# Patient Record
Sex: Male | Born: 1950 | Race: Black or African American | Hispanic: No | Smoking: Former smoker
Health system: Southern US, Community
[De-identification: ages and names within clinical notes are randomized; demographics above are authoritative.]

---

## 2011-01-18 ENCOUNTER — Other Ambulatory Visit: Payer: Self-pay | Admitting: Cardiovascular Disease

## 2011-01-18 ENCOUNTER — Ambulatory Visit
Admission: RE | Admit: 2011-01-18 | Discharge: 2011-01-18 | Disposition: A | Discharge status: Home / Self Care | Payer: 59 | Source: Ambulatory Visit | Attending: Cardiovascular Disease | Admitting: Cardiovascular Disease

## 2011-01-18 DIAGNOSIS — Z01811 Encounter for preprocedural respiratory examination: Secondary | ICD-10-CM

## 2011-01-18 DIAGNOSIS — I2510 Atherosclerotic heart disease of native coronary artery without angina pectoris: Secondary | ICD-10-CM

## 2011-01-22 ENCOUNTER — Inpatient Hospital Stay (HOSPITAL_COMMUNITY)
Admission: RE | Admit: 2011-01-22 | Discharge: 2011-01-29 | DRG: 234 | Disposition: A | Discharge status: Home / Self Care | Payer: 59 | Source: Ambulatory Visit | Attending: Thoracic Surgery (Cardiothoracic Vascular Surgery) | Admitting: Thoracic Surgery (Cardiothoracic Vascular Surgery)

## 2011-01-22 DIAGNOSIS — D62 Acute posthemorrhagic anemia: Secondary | ICD-10-CM | POA: Diagnosis not present

## 2011-01-22 DIAGNOSIS — I2510 Atherosclerotic heart disease of native coronary artery without angina pectoris: Principal | ICD-10-CM | POA: Diagnosis present

## 2011-01-22 DIAGNOSIS — F17200 Nicotine dependence, unspecified, uncomplicated: Secondary | ICD-10-CM | POA: Diagnosis present

## 2011-01-22 DIAGNOSIS — E785 Hyperlipidemia, unspecified: Secondary | ICD-10-CM | POA: Diagnosis present

## 2011-01-22 DIAGNOSIS — Z01818 Encounter for other preprocedural examination: Secondary | ICD-10-CM

## 2011-01-22 DIAGNOSIS — D696 Thrombocytopenia, unspecified: Secondary | ICD-10-CM | POA: Diagnosis not present

## 2011-01-22 DIAGNOSIS — I10 Essential (primary) hypertension: Secondary | ICD-10-CM | POA: Diagnosis present

## 2011-01-23 ENCOUNTER — Ambulatory Visit (HOSPITAL_COMMUNITY): Payer: 59

## 2011-01-23 LAB — LIPID PANEL
Cholesterol: 148 mg/dL (ref 0–200)
VLDL: 21 mg/dL (ref 0–40)

## 2011-01-23 LAB — BLOOD GAS, ARTERIAL
Acid-Base Excess: 0.7 mmol/L (ref 0.0–2.0)
Bicarbonate: 24.5 mEq/L — ABNORMAL HIGH (ref 20.0–24.0)
Patient temperature: 98.6
TCO2: 25.6 mmol/L (ref 0–100)

## 2011-01-23 LAB — CBC
MCH: 34 pg (ref 26.0–34.0)
MCHC: 34.9 g/dL (ref 30.0–36.0)
RDW: 13.7 % (ref 11.5–15.5)

## 2011-01-23 LAB — COMPREHENSIVE METABOLIC PANEL
ALT: 12 U/L (ref 0–53)
Alkaline Phosphatase: 83 U/L (ref 39–117)
BUN: 16 mg/dL (ref 6–23)
CO2: 26 mEq/L (ref 19–32)
Chloride: 101 mEq/L (ref 96–112)
GFR calc Af Amer: >60 mL/min (ref >60)
Glucose, Bld: 110 mg/dL — ABNORMAL HIGH (ref 70–99)
Potassium: 4.1 mEq/L (ref 3.5–5.1)
Total Bilirubin: 0.4 mg/dL (ref 0.3–1.2)

## 2011-01-23 LAB — URINALYSIS, ROUTINE W REFLEX MICROSCOPIC
Bilirubin Urine: NEGATIVE
Nitrite: NEGATIVE
Protein, ur: 30 mg/dL — AB
Specific Gravity, Urine: 1.014 (ref 1.005–1.030)
Urobilinogen, UA: 1 mg/dL (ref 0.0–1.0)

## 2011-01-23 LAB — HEMOGLOBIN A1C
Hgb A1c MFr Bld: 5.4 % (ref <5.7)
Mean Plasma Glucose: 108 mg/dL (ref <117)

## 2011-01-23 LAB — MRSA PCR SCREENING: MRSA by PCR: NEGATIVE

## 2011-01-23 LAB — URINE MICROSCOPIC-ADD ON

## 2011-01-23 NOTE — Cardiovascular Report (Signed)
Conley, Mason NO.:  [id number]  MEDICAL RECORD NO.:  [id number]  LOCATION:  6523                         FACILITY:  [facility]  PHYSICIAN:  Landry Corporal, MD DATE OF BIRTH:  11-27-1950  DATE OF PROCEDURE:  01/22/2011 DATE OF DISCHARGE:                           CARDIAC CATHETERIZATION   PRIMARY CARE PHYSICIAN:  Mark A. Waynard Edwards, MD.  PERFORMING PHYSICIAN:  Landry Corporal, MD.  PROCEDURES PERFORMED: 1. Native coronary angiography. 2. Intracoronary nitroglycerin.  INDICATIONS:  Abnormal Myoview.  BRIEF HISTORY:  Mason Conley is a very pleasant 60 year old gentleman with a history of hypertension, hyperlipidemia, long-term smoker with the family history of coronary artery disease who was referred to me by Dr. Waynard Edwards with abnormal EKG.  Just based on the nature of the EKG, I did perform an echocardiogram and Persantine Myoview.  The echocardiogram showed EF 50% to 55% with mild-to-moderate anterior wall hypokinesis andthe Persantine Myoview showed a moderate perfusion defect with mild peri- infarct ischemia in the mid anterior septal and anterior apical regions and without any high risk of recommending cardiac catheterization and was therefore referred for diagnostic cardiac catheterization.  The risks, benefits, alternatives, and indications of the procedure were explained to the patient in detail.  Informed consent was obtained with the signed form placed on the chart.  The patient was brought to the holding area to the Second [facility] in a fasting state.  After a Barbeau test was performed, the right wrist was prepped.  The patient was prepped and draped in the usual sterile fashion for radial artery access.  After the patient was draped, a time-out period was performed. The patient was sedated with intravenous Versed and fentanyl.  The right wrist was then anesthetized using 1% subcutaneous lidocaine and  the right radial artery accessed using the Seldinger technique placement with a 5-French sheath.  The sheath was then aspirated and flushed and infiltrated with 10 mL of dilated radial cocktail, which consists of 5 mg verapamil, 400 mcg of nitroglycerin and 2 mL of lidocaine of 1%. After this was performed, a 5-French TIGR 4.0 catheter was advanced over safety J-wire into the ascending aorta.  The catheter was used through first engage the left coronary artery and multiple angiographic views of left coronary system were obtained.  The catheter was then redirected to the right coronary artery.  Multiple angiographic views of right coronary system were obtained.  I was attempted to remove the t.i.d. catheter over a wire was noted to have spasm in the brachial artery. The patient noted some discomfort in his biceps, then removed the wire and injected initially 200 mcg of nitroglycerin and then overwired and pulled the catheter back to little further; and at this time, I gave 2.5 mg of verapamil through the catheter.  This alleviates on the discomfort and pulled the catheter completely out of body over a wire without any remaining problems.  The sheath was then removed in the cath lab with placement of TR band at 10 cm of area.  Initially due to spasm, there was negative reversed Barbeau test with plethysmography; however, the loosening some of the pressure in the bladder,  there was adequate nonocclusive hemostasis.  The patient was stable for and after the procedure.  No complications with the exception of the small amount of spasm.  With this MI,  I chose not to proceed with going up with pigtail for left ventriculogram.  He had a relatively recent echocardiogram.  CATHETERIZATION STATISTICS: 1. Total sedation was 1 mg Versed, 50 mcg of fentanyl. 2. Heparin 4000 units IV. 3. Radial cocktail as above. 4. Intracoronary nitroglycerin injection x1 200 mcg.  Intraarterial     injection of  200 mcg as well.  Additional, 2.5 mg of intraarterial     verapamil. 5. Contrast total 70 mL. 6. A 500 mL normal saline boluses measured.  HEMODYNAMICS: 1. Central aortic pressure 97/61 mmHg with a mean of 77 mmHg. 2. The EF was 50% to 55% with mild anterior hypokinesis.  ANGIOGRAPHIC FINDINGS: 1. Left main is a large-caliber vessel, representing a 30-40% stenosis     before gives rise to a LAD and circumflex artery. 2. Circumflex artery is a very tortuous course and has a very high OM     versus a ramus intermedius.  The ostial portion of the OM1, ramus     intermedius has a roughly 70% lesion followed by tubular 50-60%     lesion.  This is a very large vessel in the course down the     midportion of the heart.  The remaining of the circumflex is free     of any significant disease, but there is a very small OM2 with an ostial      60% lesion. 3. The LAD has an ostial 95% long tubular lesion.  Then in the     midportion, there is a 70% to 80% lesion involving the bifurcation     of the first diagonal branch followed by another 70% lesion with     involving the bifurcation of the second diagonal branch.  The     remainder of the LAD course down around the apex.  The tapers into     being relatively small moderate-sized vessel.  The circumflex is a     very large vessel. 4. The right coronary artery is a large-caliber dominant vessel is     free of disease in the  proximal and distal portion; however, at     the bifurcation, the posterior descending artery and the right     posterior atrioventricular groove branch.  The ostial portion of     the PDA has 80% lesion and ostial portion of the atrioventricular     groove branch has 60% lesion.  The PDA is a very large caliber vessel as the continuation of the RCA with the PDA branch being a small amount of caliber vessel.  With covering a large posterolateral distribution.  IMPRESSION: 1. Severe multivessel disease involving the  ostial proximal and mid     LAD lesions with some diffuse lesions involving the D1 and D2     ostia. 2. Severe ostial right PDA and moderate AV lesions. 3. Moderate to severe ostial and proximal high ramus intermedius.  PLAN: 1. There is a very high-grade ostial LAD lesion with the significant     lesions downstream, that is probably best served with bypass     surgery.  I was consulted to see CVTS and I was told that Dr. Cornelius Moras     will see the patient.     The patient needs to continue strenuous selective cessation  counseling. 2. He is on a statin and ACE inhibitor and was not on the beta-blocker     due to based on the bradycardia, which could be like titrated up     during hospitalization.          ______________________________ Landry Corporal, MD     DWH/MEDQ  D:  01/22/2011  T:  01/23/2011  Job:  [id number]  cc:   Loraine Leriche A. Perini, M.D. Salvatore Decent. Cornelius Moras, M.D.  Electronically Signed by Bryan Lemma MD on 01/23/2011 12:06:41 PM

## 2011-01-24 LAB — CBC
MCV: 98 fL (ref 78.0–100.0)
Platelets: 151 10*3/uL (ref 150–400)
RBC: 4.04 MIL/uL — ABNORMAL LOW (ref 4.22–5.81)
RDW: 13.4 % (ref 11.5–15.5)
WBC: 10.2 10*3/uL (ref 4.0–10.5)

## 2011-01-24 LAB — BASIC METABOLIC PANEL
CO2: 26 mEq/L (ref 19–32)
Chloride: 100 mEq/L (ref 96–112)
Creatinine, Ser: 0.83 mg/dL (ref 0.50–1.35)
GFR calc Af Amer: >60 mL/min (ref >60)
Potassium: 4.3 mEq/L (ref 3.5–5.1)
Sodium: 137 mEq/L (ref 135–145)

## 2011-01-24 LAB — ABO/RH: ABO/RH(D): O NEG

## 2011-01-25 ENCOUNTER — Ambulatory Visit (HOSPITAL_COMMUNITY): Payer: 59

## 2011-01-25 DIAGNOSIS — Z01810 Encounter for preprocedural cardiovascular examination: Secondary | ICD-10-CM

## 2011-01-25 DIAGNOSIS — I2510 Atherosclerotic heart disease of native coronary artery without angina pectoris: Secondary | ICD-10-CM

## 2011-01-25 LAB — CBC
HCT: 36.8 % — ABNORMAL LOW (ref 39.0–52.0)
Hemoglobin: 12.8 g/dL — ABNORMAL LOW (ref 13.0–17.0)
MCV: 97.9 fL (ref 78.0–100.0)
RBC: 3.76 MIL/uL — ABNORMAL LOW (ref 4.22–5.81)
RDW: 13.5 % (ref 11.5–15.5)
WBC: 7.5 10*3/uL (ref 4.0–10.5)

## 2011-01-25 LAB — BASIC METABOLIC PANEL
BUN: 17 mg/dL (ref 6–23)
CO2: 26 mEq/L (ref 19–32)
Chloride: 103 mEq/L (ref 96–112)
Creatinine, Ser: 0.85 mg/dL (ref 0.50–1.35)
GFR calc Af Amer: >60 mL/min (ref >60)
Potassium: 3.9 mEq/L (ref 3.5–5.1)

## 2011-01-26 ENCOUNTER — Inpatient Hospital Stay (HOSPITAL_COMMUNITY): Payer: 59

## 2011-01-26 DIAGNOSIS — I2510 Atherosclerotic heart disease of native coronary artery without angina pectoris: Secondary | ICD-10-CM

## 2011-01-26 LAB — POCT I-STAT 3, ART BLOOD GAS (G3+)
Acid-Base Excess: 1 mmol/L (ref 0.0–2.0)
Acid-base deficit: 2 mmol/L (ref 0.0–2.0)
Bicarbonate: 22.3 mEq/L (ref 20.0–24.0)
O2 Saturation: 99 %
O2 Saturation: 91 %
Patient temperature: 36.9
Patient temperature: 37.3
TCO2: 17 mmol/L (ref 0–100)
TCO2: 22 mmol/L (ref 0–100)
pCO2 arterial: 37.6 mmHg (ref 35.0–45.0)
pCO2 arterial: 24.4 mmHg — ABNORMAL LOW (ref 35.0–45.0)
pO2, Arterial: 131 mmHg — ABNORMAL HIGH (ref 80.0–100.0)
pO2, Arterial: 47 mmHg — ABNORMAL LOW (ref 80.0–100.0)
pO2, Arterial: 133 mmHg — ABNORMAL HIGH (ref 80.0–100.0)

## 2011-01-26 LAB — POCT I-STAT, CHEM 8
Creatinine, Ser: 0.8 mg/dL (ref 0.50–1.35)
Glucose, Bld: 105 mg/dL — ABNORMAL HIGH (ref 70–99)
Hemoglobin: 7.5 g/dL — ABNORMAL LOW (ref 13.0–17.0)
Potassium: 4.5 mEq/L (ref 3.5–5.1)
TCO2: 20 mmol/L (ref 0–100)

## 2011-01-26 LAB — POCT I-STAT 4, (NA,K, GLUC, HGB,HCT)
Glucose, Bld: 102 mg/dL — ABNORMAL HIGH (ref 70–99)
Glucose, Bld: 96 mg/dL (ref 70–99)
Glucose, Bld: 109 mg/dL — ABNORMAL HIGH (ref 70–99)
Glucose, Bld: 70 mg/dL (ref 70–99)
HCT: 36 % — ABNORMAL LOW (ref 39.0–52.0)
HCT: 23 % — ABNORMAL LOW (ref 39.0–52.0)
HCT: 24 % — ABNORMAL LOW (ref 39.0–52.0)
HCT: 26 % — ABNORMAL LOW (ref 39.0–52.0)
Hemoglobin: 12.2 g/dL — ABNORMAL LOW (ref 13.0–17.0)
Hemoglobin: 7.8 g/dL — ABNORMAL LOW (ref 13.0–17.0)
Hemoglobin: 8.8 g/dL — ABNORMAL LOW (ref 13.0–17.0)
Hemoglobin: 11.2 g/dL — ABNORMAL LOW (ref 13.0–17.0)
Potassium: 5.8 mEq/L — ABNORMAL HIGH (ref 3.5–5.1)
Potassium: 6.1 mEq/L — ABNORMAL HIGH (ref 3.5–5.1)
Potassium: 3 mEq/L — ABNORMAL LOW (ref 3.5–5.1)
Sodium: 137 mEq/L (ref 135–145)
Sodium: 131 mEq/L — ABNORMAL LOW (ref 135–145)
Sodium: 129 mEq/L — ABNORMAL LOW (ref 135–145)

## 2011-01-26 LAB — CBC
HCT: 35.7 % — ABNORMAL LOW (ref 39.0–52.0)
Hemoglobin: 12.5 g/dL — ABNORMAL LOW (ref 13.0–17.0)
MCHC: 35 g/dL (ref 30.0–36.0)
MCHC: 35.7 g/dL (ref 30.0–36.0)
MCV: 97.3 fL (ref 78.0–100.0)
Platelets: 71 10*3/uL — ABNORMAL LOW (ref 150–400)
RDW: 13.3 % (ref 11.5–15.5)
RDW: 13.4 % (ref 11.5–15.5)
WBC: 5.9 10*3/uL (ref 4.0–10.5)

## 2011-01-26 LAB — GLUCOSE, CAPILLARY: Glucose-Capillary: 87 mg/dL (ref 70–99)

## 2011-01-26 LAB — POCT I-STAT 3, VENOUS BLOOD GAS (G3P V)
Acid-base deficit: 3 mmol/L — ABNORMAL HIGH (ref 0.0–2.0)
pCO2, Ven: 34.7 mmHg — ABNORMAL LOW (ref 45.0–50.0)
pH, Ven: 7.391 — ABNORMAL HIGH (ref 7.250–7.300)
pO2, Ven: 48 mmHg — ABNORMAL HIGH (ref 30.0–45.0)

## 2011-01-26 LAB — BASIC METABOLIC PANEL
BUN: 19 mg/dL (ref 6–23)
Chloride: 98 mEq/L (ref 96–112)
Creatinine, Ser: 0.91 mg/dL (ref 0.50–1.35)
Glucose, Bld: 90 mg/dL (ref 70–99)
Potassium: 4.1 mEq/L (ref 3.5–5.1)

## 2011-01-26 LAB — CREATININE, SERUM
Creatinine, Ser: 0.69 mg/dL (ref 0.50–1.35)
GFR calc non Af Amer: >60 mL/min (ref >60)

## 2011-01-26 LAB — APTT: aPTT: 42 seconds — ABNORMAL HIGH (ref 24–37)

## 2011-01-26 LAB — PROTIME-INR: INR: 1.66 — ABNORMAL HIGH (ref 0.00–1.49)

## 2011-01-26 LAB — PLATELET COUNT: Platelets: 125 10*3/uL — ABNORMAL LOW (ref 150–400)

## 2011-01-26 LAB — POCT I-STAT GLUCOSE: Glucose, Bld: 101 mg/dL — ABNORMAL HIGH (ref 70–99)

## 2011-01-27 ENCOUNTER — Inpatient Hospital Stay (HOSPITAL_COMMUNITY): Payer: 59

## 2011-01-27 LAB — CBC
HCT: 25.7 % — ABNORMAL LOW (ref 39.0–52.0)
Hemoglobin: 8.8 g/dL — ABNORMAL LOW (ref 13.0–17.0)
MCH: 34.7 pg — ABNORMAL HIGH (ref 26.0–34.0)
MCHC: 35.2 g/dL (ref 30.0–36.0)
Platelets: 94 10*3/uL — ABNORMAL LOW (ref 150–400)
RBC: 1.99 MIL/uL — ABNORMAL LOW (ref 4.22–5.81)
WBC: 8 10*3/uL (ref 4.0–10.5)

## 2011-01-27 LAB — GLUCOSE, CAPILLARY
Glucose-Capillary: 118 mg/dL — ABNORMAL HIGH (ref 70–99)
Glucose-Capillary: 108 mg/dL — ABNORMAL HIGH (ref 70–99)
Glucose-Capillary: 110 mg/dL — ABNORMAL HIGH (ref 70–99)
Glucose-Capillary: 141 mg/dL — ABNORMAL HIGH (ref 70–99)
Glucose-Capillary: 114 mg/dL — ABNORMAL HIGH (ref 70–99)
Glucose-Capillary: 138 mg/dL — ABNORMAL HIGH (ref 70–99)

## 2011-01-27 LAB — BASIC METABOLIC PANEL
BUN: 13 mg/dL (ref 6–23)
Calcium: 7.8 mg/dL — ABNORMAL LOW (ref 8.4–10.5)
Creatinine, Ser: 0.7 mg/dL (ref 0.50–1.35)
GFR calc Af Amer: >60 mL/min (ref >60)
GFR calc non Af Amer: >60 mL/min (ref >60)

## 2011-01-27 LAB — CREATININE, SERUM
GFR calc Af Amer: >60 mL/min (ref >60)
GFR calc non Af Amer: >60 mL/min (ref >60)

## 2011-01-27 LAB — POCT I-STAT, CHEM 8
Chloride: 100 mEq/L (ref 96–112)
HCT: 26 % — ABNORMAL LOW (ref 39.0–52.0)
Potassium: 4.1 mEq/L (ref 3.5–5.1)

## 2011-01-27 LAB — MAGNESIUM
Magnesium: 2.3 mg/dL (ref 1.5–2.5)
Magnesium: 1.9 mg/dL (ref 1.5–2.5)

## 2011-01-27 LAB — PREPARE RBC (CROSSMATCH)

## 2011-01-27 NOTE — Op Note (Signed)
Mason Conley, Mason Conley NO.:  [id number]  MEDICAL RECORD NO.:  [id number]  LOCATION:  2312                         FACILITY:  [facility]  PHYSICIAN:  Salvatore Decent. Cornelius Moras, M.D. DATE OF BIRTH:  07/17/1951  DATE OF PROCEDURE:  01/26/2011 DATE OF DISCHARGE:                              OPERATIVE REPORT   PREOPERATIVE DIAGNOSIS:  Severe two-vessel coronary artery disease.  POSTOPERATIVE DIAGNOSIS:  Severe two-vessel coronary artery disease.  PROCEDURE:  Median sternotomy for coronary artery bypass grafting x3 (left internal mammary artery to distal left anterior descending coronary artery, right internal mammary artery to ramus intermediate branch, saphenous vein graft to posterior descending coronary artery with endoscopic saphenous vein harvest from right thigh).  SURGEON:  Salvatore Decent. Cornelius Moras, MD  ASSISTANT:  Stephanie Acre. Dasovich, PA  ANESTHESIA:  Maren Beach, MD  BRIEF CLINICAL NOTE:  The patient is a 60 year old male with no previous history of coronary artery disease, but risk factors notable for history of hypertension, hyperlipidemia, and long-term tobacco abuse.  The patient was noted to have abnormal electrocardiogram which prompted referral for stress test which was abnormal.  Cardiac catheterization performed by Dr. Bryan Lemma, demonstrates critical two-vessel coronary artery disease with high-grade ostial and proximal stenosis of the left anterior descending coronary artery.  Left ventricular function was preserved.  Coronary anatomy is unfavorable for percutaneous coronary intervention.  A full consultation note has been dictated previously.  The patient has been counseled regarding the indications, risks, and potential benefits of surgery.  Alternative treatment strategies have been discussed.  The patient understands and accepts all associated risks of surgery and desires to proceed as described.  OPERATIVE FINDINGS: 1. Normal left ventricular  function. 2. Small-caliber but otherwise good-quality left and right internal     mammary artery conduit for grafting. 3. Good-quality saphenous vein conduit for grafting. 4. Small-caliber epicardial coronary arteries.  OPERATIVE PROCEDURE IN DETAIL:  The patient was brought to the operating room on the above-mentioned date and central monitoring was established by the anesthesia team under the care and direction of Dr. Diamantina Monks. Specifically, a Swan-Ganz catheter was placed through the right internal jugular approach.  A radial arterial line was placed.  Intravenous antibiotics were administered.  The patient was placed in the supine position on the operating table.  The patient's chest, left upper extremity, abdomen, both groins, and both lower extremities were prepared and draped in sterile manner.  Baseline transesophageal echocardiogram was performed by Dr. Katrinka Blazing.  No specific abnormalities are commented upon.  A median sternotomy incision is performed and the left internal mammary artery was dissected from the chest wall and prepared for bypass grafting.  Following this, right internal mammary artery was dissected from the chest wall and prepared for bypass grafting.  Both vessels are relatively small-caliber, but otherwise normal in appearance.  Following systemic heparinization, the vessels were both transected distally and note to have good flow.  Simultaneously, saphenous vein was obtained from the patient's right thigh using endoscopic vein harvest technique through a small incision made just above the right knee.  The saphenous vein was good-quality conduit.  After the saphenous vein has been removed, the small incision  in the right lower extremity is closed with absorbable suture.  The pericardium was opened.  The ascending aorta is normal in appearance.  The ascending aorta and the right atrium were cannulated for cardiopulmonary bypass.  Cardiopulmonary bypass was  begun and the surface of the heart was inspected.  Distal target vessels were selected for coronary bypass grafting.  There is some scar in the distal anterior wall and apex suggestive of remote history of myocardial infarction. Epicardial coronary arteries are good-quality, but small caliber.  The first diagonal branch of the left anterior descending coronary artery was quite small and too small for grafting.  A cardioplegic cannula was placed in the ascending aorta.  The patient is allowed to cool passively to 32 degrees systemic temperature.  The aortic crossclamp was applied and cold blood cardioplegia is delivered in antegrade fashion through the aortic root. Iced saline slush was applied for topical hypothermia.  The initial cardioplegic arrest was rapid with early diastolic arrest.  Repeat doses of cardioplegia are administered intermittently throughout the crossclamp portion of the operation through the aortic root and down the subsequently placed vein graft to maintain completely flat electrocardiogram and septal myocardial temperature below 15 degrees centigrade.  The following distal coronary anastomoses were performed: 1. Posterior descending coronary artery was grafted with a saphenous     vein graft in end-to-side fashion.  This vessel measures 1.7 mm in     diameter and is a good-quality target vessel for grafting. 2. The ramus intermediate branch is grafted with the right internal     mammary artery in end-to-side fashion.  The right internal mammary     artery is utilized in situ with the pedicle placed posterior to the     aorta through the transverse sinus.  The ramus intermediate branch     measures 1.5 mm in diameter and is a good-quality target vessel at     the site of distal grafting. 3. The distal left anterior descending coronary artery is grafted with     left internal mammary artery in end-to-side fashion.  This vessel     measured 1.4 mm in diameter and is  good-quality target vessel for     grafting.  The single proximal saphenous vein anastomoses is performed directly to the ascending aorta prior to removal of the aortic crossclamp.  The left ventricular septal temperature rises rapidly with reperfusion of the internal mammary artery grafts.  The aortic crossclamp was removed after a total crossclamp time of 59 minutes.  The heart begins to beat spontaneously without need for cardioversion.  All proximal and distal coronary anastomoses were inspected for hemostasis and appropriate graft orientation.  Epicardial pacing wires were fixed to the right ventricular free wall into the right atrial appendage.  The patient is rewarmed to 37 degrees centigrade temperature.  The patient is weaned from cardiopulmonary bypass without difficulty.  The patient's rhythm at separation from bypass was AV paced.  Total cardiopulmonary bypass time for the operation is 73 minutes.  No inotropic support is required. Followup transesophageal echocardiogram performed by Dr. Katrinka Blazing, after separation from bypass demonstrates normal left ventricular function.  The venous and arterial cannulae are removed uneventfully.  Protamine was administered to reverse the anticoagulation.  The mediastinum in the left and right pleural spaces are all irrigated with saline solution. Mediastinum in the left and right pleural spaces were drained with a combination of four chest tubes placed through separate stab incisions inferiorly.  This pericardium and soft tissue  is anterior to the aorta, reapproximated loosely.  The sternum was closed using double-strength sternal wire.  The soft tissue is anterior to the sternum were closed in multiple layers and the skin was closed with a running subcuticular skin closure.  The patient tolerated the procedure well and was transported to the surgical intensive care unit in stable condition.  There were no intraoperative complications.  All  sponge, instrument, and needle counts were verified correct at completion of the operation.  No blood products were administered.     Salvatore Decent. Cornelius Moras, M.D.     CHO/MEDQ  D:  01/26/2011  T:  01/27/2011  Job:  [id number]  cc:   Landry Corporal, MD Redge Gainer Perini, M.D.  Electronically Signed by Tressie Stalker M.D. on 01/27/2011 04:22:35 PM

## 2011-01-27 NOTE — Consult Note (Signed)
Mason Conley, Mason Conley                ACCOUNT NO.:  [id number]  MEDICAL RECORD NO.:  [id number]  LOCATION:  6523                         FACILITY:  [facility]  PHYSICIAN:  Salvatore Decent. Cornelius Moras, M.D. DATE OF BIRTH:  1951/06/02  DATE OF CONSULTATION:  01/22/2011 DATE OF DISCHARGE:                                CONSULTATION   REQUESTING PHYSICIAN:  Landry Corporal, MD  REASON FOR CONSULTATION:  Severe three-vessel coronary artery disease.  HISTORY OF PRESENT ILLNESS:  Mason Conley is a 60 year old gentleman from [place] with no previous history of coronary artery disease, but risk factors notable for history of hypertension, hyperlipidemia, and long- term tobacco abuse.  Mason Conley was in his usual state of health, but noted to have an abnormal electrocardiogram on followup with his primary care physician, Dr. Rodrigo Ran.  Mason Conley was referred to Dr. Bryan Lemma who saw Mason Conley electively in consultation.  Stress test was performed demonstrating possibility of remote anterior wall myocardial infarction with normal left ventricular function.  Mason Conley ultimately underwent elective cardiac catheterization earlier this afternoon.  This was found to reveal critical three-vessel coronary artery disease with normal left ventricular function.  Cardiothoracic surgical consultation has been requested.  REVIEW OF SYSTEMS:  GENERAL:  Mason Conley reports normal appetite.  He has not been gaining, nor losing weight recently.  CARDIAC:  Mason Conley specifically denies any history of chest pain, chest pressure, or chest tightness with activity or at rest.  He does note that last summer, he had a few episode of exertional tightness across his chest that occurred during Mason very hot months.  He has not had any similar episodes since that time.  He denies any shortness of breath either with activity or at rest.  He denies PND, orthopnea, or lower extremity edema.  He has not had tachy  palpitations, nor syncope.  RESPIRATORY:  Negative.  Mason Conley denies productive cough, hemoptysis, or wheezing. GASTROINTESTINAL:  Negative.  Mason Conley has no difficulty swallowing. He reports normal bowel function.  He denies hematochezia, hematemesis, or melena.  MUSCULOSKELETAL:  Notable for mild chronic back pain, which does not limit him much physically.  Mason Conley otherwise denies significant arthritis or arthralgias.  NEUROLOGIC:  Negative.  Mason Conley denies symptoms suggestive of previous TIA or stroke. GENITOURINARY:  Negative.  HEENT:  Negative.  INFECTIOUS:  Negative. PSYCHIATRIC:  Negative.  PAST MEDICAL HISTORY: 1. Hypertension. 2. Hyperlipidemia. 3. Long standing tobacco abuse.  PAST SURGICAL HISTORY:  None.  FAMILY HISTORY:  Mason Conley's parents both had coronary artery disease, but developed at a later age.  SOCIAL HISTORY:  Mason Conley is divorced and lives alone here in [place].  He works for [employer].  He smokes approximately 1 pack of cigarettes daily and has done so for at least 15-20 years.  He denies excessive alcohol consumption.  MEDICATIONS PRIOR TO ADMISSION: 1. Lisinopril 5 mg daily. 2. Lipitor 20 mg daily. 3. Vitamin D 1000 units daily.  DRUG ALLERGIES:  None known.  PHYSICAL EXAMINATION:  GENERAL:  Mason Conley is a thin well-appearing male who appears his stated age in no acute distress.  Mason Conley is in  normal sinus rhythm. HEENT:  Unrevealing. NECK:  Supple.  There is no cervical, nor supraclavicular lymphadenopathy.  There is no jugular venous distention.  No carotid bruits are noted. LUNGS:  Auscultation of Mason chest demonstrates clear breath sounds, which are symmetrical bilaterally.  No wheezes, rales, or rhonchi are noted. CARDIOVASCULAR:  Regular rate and rhythm.  No murmurs, rubs, or gallops are appreciated. ABDOMEN:  Soft, nondistended, and nontender.  Bowel sounds are present. EXTREMITIES:  Warm and well  perfused.  There is no lower extremity edema.  Distal pulses are palpable in both lower legs at Mason ankle. RECTAL AND GU:  Both deferred. NEUROLOGIC:  Grossly nonfocal and symmetrical throughout.  DIAGNOSTIC TESTS:  Cardiac catheterization performed by Dr. Herbie Baltimore is reviewed.  This demonstrates critical three-vessel coronary artery disease with preserved left ventricular function.  Specifically, there is 90% ostial stenosis of Mason left anterior descending coronary artery with tandem 80% lesions in Mason proximal and mid left anterior descending coronary artery involving takeoff of Mason first and second diagonal branch.  There is 60% proximal stenosis of a ramus intermediate branch. There does not appear to be any significant disease in Mason left circumflex system.  Mason right coronary artery is dominant.  There is 80% stenosis at Mason origin of Mason posterior descending coronary artery. Left ventricular function appears normal.  IMPRESSION:  Severe three-vessel coronary artery disease with normal left ventricular function.  I agree that Mason Conley would best be treated with coronary artery bypass grafting.  Despite Mason absence of symptomatology, he has critical coronary anatomy with high-grade ostial left anterior descending coronary artery stenosis.  Coronary anatomy is completely unfavorable for percutaneous revascularization.  PLAN:  I have discussed options at length with Mason Conley this afternoon. Alternative to treatment strategies have been discussed.  He understands and accepts all potential associated risks of surgery including, but not limited to risk of death, stroke, myocardial infarction, congestive heart failure, respiratory failure, renal failure, bleeding requiring blood transfusion, arrhythmia, infection, and recurrent coronary artery disease.  He has also been counseled regarding Mason need to quit smoking. All of his questions have been addressed.     Salvatore Decent. Cornelius Moras,  M.D.     CHO/MEDQ  D:  01/22/2011  T:  01/23/2011  Job:  [id number]  cc:   Landry Corporal, MD Redge Gainer Perini, M.D.  Electronically Signed by Tressie Stalker M.D. on 01/27/2011 04:22:30 PM

## 2011-01-28 ENCOUNTER — Inpatient Hospital Stay (HOSPITAL_COMMUNITY): Payer: 59

## 2011-01-28 LAB — BASIC METABOLIC PANEL
Chloride: 101 mEq/L (ref 96–112)
Creatinine, Ser: 0.67 mg/dL (ref 0.50–1.35)
GFR calc Af Amer: >60 mL/min (ref >60)
Potassium: 3.6 mEq/L (ref 3.5–5.1)

## 2011-01-28 LAB — GLUCOSE, CAPILLARY
Glucose-Capillary: 96 mg/dL (ref 70–99)
Glucose-Capillary: 106 mg/dL — ABNORMAL HIGH (ref 70–99)

## 2011-01-28 LAB — TYPE AND SCREEN
Antibody Screen: NEGATIVE
Unit division: 0

## 2011-01-28 LAB — CBC
MCV: 91.5 fL (ref 78.0–100.0)
Platelets: 84 10*3/uL — ABNORMAL LOW (ref 150–400)
RDW: 17.5 % — ABNORMAL HIGH (ref 11.5–15.5)
WBC: 7.9 10*3/uL (ref 4.0–10.5)

## 2011-01-29 ENCOUNTER — Inpatient Hospital Stay (HOSPITAL_COMMUNITY): Payer: 59

## 2011-01-29 LAB — BASIC METABOLIC PANEL
GFR calc Af Amer: >60 mL/min (ref >60)
GFR calc non Af Amer: >60 mL/min (ref >60)
Glucose, Bld: 104 mg/dL — ABNORMAL HIGH (ref 70–99)
Potassium: 3.7 mEq/L (ref 3.5–5.1)
Sodium: 134 mEq/L — ABNORMAL LOW (ref 135–145)

## 2011-01-29 LAB — CBC
Hemoglobin: 9.1 g/dL — ABNORMAL LOW (ref 13.0–17.0)
MCHC: 35.8 g/dL (ref 30.0–36.0)
Platelets: 91 10*3/uL — ABNORMAL LOW (ref 150–400)
RDW: 16.6 % — ABNORMAL HIGH (ref 11.5–15.5)

## 2011-01-29 LAB — GLUCOSE, CAPILLARY: Glucose-Capillary: 112 mg/dL — ABNORMAL HIGH (ref 70–99)

## 2011-02-08 NOTE — Discharge Summary (Signed)
NAMEWALID, Mason Conley                ACCOUNT NO.:  [id number]  MEDICAL RECORD NO.:  [id number]  LOCATION:  2040                         FACILITY:  [facility]  PHYSICIAN:  Salvatore Decent. Cornelius Moras, M.D. DATE OF BIRTH:  July 22, 1951  DATE OF ADMISSION:  01/22/2011 DATE OF DISCHARGE:  01/29/2011                              DISCHARGE SUMMARY   ADMITTING DIAGNOSES: 1. Multi vessel coronary artery disease (with an EF of 50-55%). 2. History of hypertension. 3. History of hyperlipidemia. 4. History of tobacco abuse.  DISCHARGE DIAGNOSES: 1. Multi vessel coronary artery disease (with an EF of 50-55%). 2. History of hypertension. 3. History of hyperlipidemia. 4. History of tobacco abuse 5. Acute blood loss anemia. 6. Thrombocytopenia.  PROCEDURES: 1. Cardiac catheterization performed by Dr. Herbie Baltimore on January 22, 2011. 2. CABG x3 (LIMA to LAD, RIMA to ramus intermediate and SVG to     posterior descending coronary artery with EVH from the right thigh     by Dr. Cornelius Moras on January 26, 2011.  HISTORY OF PRESENT ILLNESS:  This is a 60 year old Caucasian male with the aforementioned past medical history who was in his usual state of health but was found to have an abnormal EKG by his primary care physician Dr. Rodrigo Ran.  The patient was referred to Dr. Herbie Baltimore who then electively saw the patient in consultation.  A stress test was performed which demonstrated the possibility of remote anterior wall myocardial infarction with preserved left ventricular function..  The patient presented to [facility] January 22, 2011 in order to undergo an elective cardiac catheterization.  He was found to have critical three- vessel coronary artery disease with an EF of 50-55%.  A cardiothoracic consultation was obtained with Dr. Cornelius Moras for the consideration of coronary bypass grafting surgery.  It should be noted that the patient did undergo preoperative carotid duplex carotid ultrasound which showed no significant internal  carotid artery stenoses.  Potential risks, complications and benefits of the surgery discussed with the patient and he agreed to proceed with surgery.  He underwent CABG x3 on January 26, 2011.  BRIEF HOSPITAL COURSE STAY:  The patient was extubated the evening of surgery without difficulty.  He remained afebrile and hemodynamically stable.  He initially did require a couple liters of oxygen via nasal cannula, but was able to be weaned off on room air.  [place] A-line chest tubes and Foley were all removed early in his postoperative course.  He was found to have acute blood loss anemia.  His H and H was low at 6.9 and 19.6, he was given a unit of packed red blood cells. Followup H and H are 8.8 and 25.7, and his last H and H are 9.1 and 25.4 respectively.  He also was found to have thrombocytopenia.  His platelet count went as low as 84,000; however, his last platelet count was up to 91,000.  The patient was started on low-dose beta-blocker.  He was then felt surgically stable for transfer from the Intensive Care Unit into [facility] for further convalescence on January 28, 2011.  He did not have much of an appetite initially postoperatively; however, this did improve over the next couple of days.  Currently, he is tolerating a heart- healthy diet.  He has already had a bowel movement.  He is ambulating fairly well on his own.  Currently, he has a T-max of 99.  His heart rate is in the 70s to 80s, BP 100/62, O2 sat of 91-94% on room air.  His preoperative weight was 66 kg, today's weight is 60.8 kg. Cardiovascular:  Regular rate and  rhythm.  Lungs: Clear to auscultation bilaterally.  Abdomen: Soft, nontender.  Bowel sounds present.  Minor lower extremity edema right greater than left.  Sternal and lower extremity wounds were clean, dry and continuing to heal.  The patient has been seen and evaluated by Dr. Cornelius Moras today and he is felt surgically stable for discharge.  Epicardial pacing wires and  chest tube sutures will be removed prior to the patient's discharge.  Latest laboratory studies revealed a BMET around 629, potassium 3.7, this will be supplemented, sodium 134, BUN and creatinine 15 and 0.89 respectively.  CBC on this date H and H was 9.1 and 25.4, white count of 7000, plate count 16,109.  Chest x-ray done today showed bilateral pleural effusions with patchy bibasilar atelectasis, no pneumothorax.  Discharge instructions include the following:  DIET:  The patient should remain on a low-sodium heart-healthy diet.  ACTIVITY:  The patient may walk up steps.  He may shower.  He is not to lift more than 10 pounds for 2 weeks and to drive until after 2 weeks, he is to continue with his breathing exercise daily to walk daily and increase friction duration as tolerates.  WOUND CARE:  He is to use soap and water on his wounds.  He is to contact our office if any wound problems arise.  FOLLOWUP APPOINTMENTS:  Include 1. The patient is to contact Dr. Elissa Hefty office to make a follow up     appointment in 2 weeks. 2. The patient has an appointment to see Dr. Cornelius Moras on February 21, 2011, at     10:00 a.m. 45 minutes prior to this office appointment a chest x-     ray should be obtained.  DISCHARGE MEDICATIONS:  Include the following 1. Enteric-coated aspirin 325 mg p.o. daily. 2. Lasix 40 mg p.o. daily x5 days. 3. Lopressor 25 mg one half tablet p.o. two times daily. 4. Oxycodone 5 mg 1-2 tablets p.o. q. 4-6 hours p.o. p.r.n. pain. 5. Potassium chloride 20 mEq p.o. daily x5 days. 6. Atorvastatin 20 mg p.o. at bedtime. 7. Vitamin D3 1000 units p.o. daily.  Please note, the patient was not discharged home on an ACE inhibitor secondary to good blood pressure control with the Lopressor and a preserved EF.     Doree Fudge, PA   ______________________________ Salvatore Decent Cornelius Moras, M.D.    DZ/MEDQ  D:  01/29/2011  T:  01/30/2011  Job:  [id number]  Electronically Signed  by Doree Fudge PA on 01/30/2011 11:10:04 AM Electronically Signed by Tressie Stalker M.D. on 02/08/2011 08:53:02 AM

## 2011-02-19 ENCOUNTER — Other Ambulatory Visit: Payer: Self-pay | Admitting: Thoracic Surgery (Cardiothoracic Vascular Surgery)

## 2011-02-19 DIAGNOSIS — I2510 Atherosclerotic heart disease of native coronary artery without angina pectoris: Secondary | ICD-10-CM

## 2011-02-22 ENCOUNTER — Ambulatory Visit (INDEPENDENT_AMBULATORY_CARE_PROVIDER_SITE_OTHER): Payer: Self-pay | Admitting: Thoracic Surgery (Cardiothoracic Vascular Surgery)

## 2011-02-22 ENCOUNTER — Ambulatory Visit
Admission: RE | Admit: 2011-02-22 | Discharge: 2011-02-22 | Disposition: A | Discharge status: Home / Self Care | Payer: 59 | Source: Ambulatory Visit | Attending: Thoracic Surgery (Cardiothoracic Vascular Surgery) | Admitting: Thoracic Surgery (Cardiothoracic Vascular Surgery)

## 2011-02-22 DIAGNOSIS — I2510 Atherosclerotic heart disease of native coronary artery without angina pectoris: Secondary | ICD-10-CM

## 2011-02-22 NOTE — Assessment & Plan Note (Signed)
OFFICE VISIT  Mason, Conley DOB:  [birth year]                                        February 22, 2011 CHART #:  [id number]  HISTORY OF PRESENT ILLNESS:  The patient returns for routine followup status post coronary artery bypass grafting x3 using bilateral internal mammary artery grafts on January 26, 2011.  His postoperative recovery has been completely uneventful.  Since hospital discharge, he is continued to do quite well.  He has not yet been seen in followup by Dr. Herbie Baltimore. He reports mild residual soreness in his chest with particular sensitivity of the skin overlying the anterior chest wall as is typical for patient with internal mammary artery harvest and grafting.  He has had no fevers or chills.  He does not appreciate any sort of clicking or motion of the sternum.  He is no longer taking any pain relievers during the day and only occasionally takes pain relievers at night to help him rest.  He has no shortness of breath.  His appetite is good.  His activity level is good.  He has no complaints.  Medications remain unchanged from the time of hospital discharge.  PHYSICAL EXAMINATION:  GENERAL:  Well-appearing male. VITAL SIGNS:  Blood pressure 152/95, pulse 108, and oxygen saturation 99% on room air. CHEST:  Median sternotomy incision that is healing nicely.  The sternum is stable on palpation.  Breath sounds are clear to auscultation and symmetrical bilaterally.  No wheezes, rales, or rhonchi noted. CARDIOVASCULAR:  Demonstrates regular rate and rhythm.  No murmurs, rubs, or gallops are appreciated. ABDOMEN:  Soft and nontender. EXTREMITIES:  Warm and well perfused.  The small incision in the right leg from endoscopic vein harvest is healed nicely.  There is no lower extremity edema.  DIAGNOSTICS TEST:  Chest x-ray performed today demonstrates clear lung fields bilaterally.  There are no pleural effusions.  All the sternal wires appear  intact.  IMPRESSION:  Excellent progress following coronary artery bypass grafting.  PLAN:  I have encouraged the patient to continue to gradually increase his physical activity as tolerated with his only limitation at this point remaining that he refrain from heavy lifting or strenuous use of his arms or shoulders for least another 2 months.  He has been reminded to continue to abstain from any tobacco use.  All of his questions have been addressed.  We have instructed him to increase his dose of metoprolol to 25 mg p.o. twice daily because of his elevated pulse and blood pressure.  He has been reminded to schedule an appointment to see Dr. Herbie Baltimore at some point within the next week or two.  In the future, he will call and return to see [place] as needed.  Mason Conley, M.D. Electronically Signed  CHO/MEDQ  D:  02/22/2011  T:  02/22/2011  Job:  [id number]  cc:   Landry Corporal, MD Redge Gainer Perini, M.D.

## 2011-07-19 DIAGNOSIS — Z0271 Encounter for disability determination: Secondary | ICD-10-CM

## 2011-10-05 ENCOUNTER — Ambulatory Visit
Admission: RE | Admit: 2011-10-05 | Discharge: 2011-10-05 | Disposition: A | Discharge status: Home / Self Care | Payer: 59 | Source: Ambulatory Visit | Attending: Internal Medicine | Admitting: Internal Medicine

## 2011-10-05 ENCOUNTER — Other Ambulatory Visit: Payer: Self-pay | Admitting: Internal Medicine

## 2011-10-05 DIAGNOSIS — M542 Cervicalgia: Secondary | ICD-10-CM

## 2012-01-12 ENCOUNTER — Encounter (HOSPITAL_COMMUNITY)
Admission: RE | Admit: 2012-01-12 | Discharge: 2012-01-12 | Disposition: A | Discharge status: Home / Self Care | Payer: Self-pay | Source: Ambulatory Visit | Attending: Cardiology | Admitting: Cardiology

## 2012-01-12 ENCOUNTER — Encounter (HOSPITAL_COMMUNITY): Payer: Self-pay

## 2012-01-12 DIAGNOSIS — F17200 Nicotine dependence, unspecified, uncomplicated: Secondary | ICD-10-CM | POA: Insufficient documentation

## 2012-01-12 DIAGNOSIS — I2510 Atherosclerotic heart disease of native coronary artery without angina pectoris: Secondary | ICD-10-CM | POA: Insufficient documentation

## 2012-01-12 DIAGNOSIS — I10 Essential (primary) hypertension: Secondary | ICD-10-CM | POA: Insufficient documentation

## 2012-01-12 DIAGNOSIS — Z5189 Encounter for other specified aftercare: Secondary | ICD-10-CM | POA: Insufficient documentation

## 2012-01-12 DIAGNOSIS — D696 Thrombocytopenia, unspecified: Secondary | ICD-10-CM | POA: Insufficient documentation

## 2012-01-12 DIAGNOSIS — E785 Hyperlipidemia, unspecified: Secondary | ICD-10-CM | POA: Insufficient documentation

## 2012-01-12 NOTE — Progress Notes (Signed)
Oriented patient to the Cardiac Rehab Maintenance Program. Pt tolerated exercise well without c/o. Patient states he has had flu and pneumonia vaccine this year.

## 2012-01-14 ENCOUNTER — Encounter (HOSPITAL_COMMUNITY)
Admission: RE | Admit: 2012-01-14 | Discharge: 2012-01-14 | Disposition: A | Discharge status: Home / Self Care | Payer: Self-pay | Source: Ambulatory Visit | Attending: Cardiology | Admitting: Cardiology

## 2012-01-17 ENCOUNTER — Encounter (HOSPITAL_COMMUNITY)
Admission: RE | Admit: 2012-01-17 | Discharge: 2012-01-17 | Disposition: A | Discharge status: Home / Self Care | Payer: Self-pay | Source: Ambulatory Visit | Attending: Cardiology | Admitting: Cardiology

## 2012-01-17 NOTE — Progress Notes (Signed)
Reviewed home exercise guidelines with patient. Patient is walking for his home exercise and voices understanding of instructions given.

## 2012-01-19 ENCOUNTER — Encounter (HOSPITAL_COMMUNITY): Payer: Self-pay

## 2012-01-21 ENCOUNTER — Encounter (HOSPITAL_COMMUNITY)
Admission: RE | Admit: 2012-01-21 | Discharge: 2012-01-21 | Disposition: A | Discharge status: Home / Self Care | Payer: Self-pay | Source: Ambulatory Visit | Attending: Cardiology | Admitting: Cardiology

## 2012-01-24 ENCOUNTER — Encounter (HOSPITAL_COMMUNITY)
Admission: RE | Admit: 2012-01-24 | Discharge: 2012-01-24 | Disposition: A | Discharge status: Home / Self Care | Payer: Self-pay | Source: Ambulatory Visit | Attending: Cardiology | Admitting: Cardiology

## 2012-01-26 ENCOUNTER — Encounter (HOSPITAL_COMMUNITY): Payer: Self-pay

## 2012-01-28 ENCOUNTER — Encounter (HOSPITAL_COMMUNITY): Payer: Self-pay

## 2012-01-31 ENCOUNTER — Encounter (HOSPITAL_COMMUNITY): Payer: Self-pay | Attending: Cardiology

## 2012-01-31 DIAGNOSIS — I2510 Atherosclerotic heart disease of native coronary artery without angina pectoris: Secondary | ICD-10-CM | POA: Insufficient documentation

## 2012-01-31 DIAGNOSIS — F17200 Nicotine dependence, unspecified, uncomplicated: Secondary | ICD-10-CM | POA: Insufficient documentation

## 2012-01-31 DIAGNOSIS — I10 Essential (primary) hypertension: Secondary | ICD-10-CM | POA: Insufficient documentation

## 2012-01-31 DIAGNOSIS — D696 Thrombocytopenia, unspecified: Secondary | ICD-10-CM | POA: Insufficient documentation

## 2012-01-31 DIAGNOSIS — Z5189 Encounter for other specified aftercare: Secondary | ICD-10-CM | POA: Insufficient documentation

## 2012-01-31 DIAGNOSIS — E785 Hyperlipidemia, unspecified: Secondary | ICD-10-CM | POA: Insufficient documentation

## 2012-02-02 ENCOUNTER — Encounter (HOSPITAL_COMMUNITY): Payer: Self-pay

## 2012-02-04 ENCOUNTER — Encounter (HOSPITAL_COMMUNITY): Payer: Self-pay

## 2012-02-07 ENCOUNTER — Encounter (HOSPITAL_COMMUNITY): Payer: Self-pay

## 2012-02-09 ENCOUNTER — Encounter (HOSPITAL_COMMUNITY): Payer: Self-pay

## 2012-02-11 ENCOUNTER — Encounter (HOSPITAL_COMMUNITY): Payer: Self-pay

## 2012-02-14 ENCOUNTER — Encounter (HOSPITAL_COMMUNITY): Payer: Self-pay

## 2012-02-15 ENCOUNTER — Telehealth (HOSPITAL_COMMUNITY): Payer: Self-pay | Admitting: *Deleted

## 2012-02-16 ENCOUNTER — Encounter (HOSPITAL_COMMUNITY): Payer: Self-pay

## 2012-02-18 ENCOUNTER — Encounter (HOSPITAL_COMMUNITY): Payer: Self-pay

## 2012-02-21 ENCOUNTER — Encounter (HOSPITAL_COMMUNITY): Payer: Self-pay

## 2012-02-23 ENCOUNTER — Encounter (HOSPITAL_COMMUNITY): Payer: Self-pay

## 2012-02-25 ENCOUNTER — Encounter (HOSPITAL_COMMUNITY): Payer: Self-pay

## 2012-02-28 ENCOUNTER — Encounter (HOSPITAL_COMMUNITY): Payer: Self-pay

## 2012-03-01 ENCOUNTER — Encounter (HOSPITAL_COMMUNITY): Payer: Self-pay

## 2012-03-03 ENCOUNTER — Encounter (HOSPITAL_COMMUNITY): Payer: 59

## 2012-03-06 ENCOUNTER — Encounter (HOSPITAL_COMMUNITY): Payer: 59

## 2012-03-08 ENCOUNTER — Encounter (HOSPITAL_COMMUNITY): Payer: 59

## 2012-03-10 ENCOUNTER — Encounter (HOSPITAL_COMMUNITY): Payer: 59

## 2012-03-13 ENCOUNTER — Encounter (HOSPITAL_COMMUNITY): Payer: 59

## 2012-03-15 ENCOUNTER — Encounter (HOSPITAL_COMMUNITY): Payer: 59

## 2012-03-17 ENCOUNTER — Encounter (HOSPITAL_COMMUNITY): Payer: 59

## 2012-03-20 ENCOUNTER — Encounter (HOSPITAL_COMMUNITY): Payer: 59

## 2012-03-22 ENCOUNTER — Encounter (HOSPITAL_COMMUNITY): Payer: 59

## 2012-03-24 ENCOUNTER — Encounter (HOSPITAL_COMMUNITY): Payer: 59

## 2012-03-27 ENCOUNTER — Encounter (HOSPITAL_COMMUNITY): Payer: 59

## 2012-03-29 ENCOUNTER — Encounter (HOSPITAL_COMMUNITY): Payer: 59

## 2012-03-31 ENCOUNTER — Encounter (HOSPITAL_COMMUNITY): Payer: 59

## 2012-04-05 ENCOUNTER — Encounter (HOSPITAL_COMMUNITY): Payer: 59

## 2012-04-07 ENCOUNTER — Encounter (HOSPITAL_COMMUNITY): Payer: 59

## 2012-04-10 ENCOUNTER — Encounter (HOSPITAL_COMMUNITY): Payer: 59

## 2012-04-12 ENCOUNTER — Encounter (HOSPITAL_COMMUNITY): Payer: 59

## 2012-04-14 ENCOUNTER — Encounter (HOSPITAL_COMMUNITY): Payer: 59

## 2012-04-17 ENCOUNTER — Encounter (HOSPITAL_COMMUNITY): Payer: 59

## 2012-04-19 ENCOUNTER — Encounter (HOSPITAL_COMMUNITY): Payer: 59

## 2012-04-21 ENCOUNTER — Encounter (HOSPITAL_COMMUNITY): Payer: 59

## 2012-04-24 ENCOUNTER — Encounter (HOSPITAL_COMMUNITY): Payer: 59

## 2012-04-26 ENCOUNTER — Encounter (HOSPITAL_COMMUNITY): Payer: 59

## 2012-04-28 ENCOUNTER — Encounter (HOSPITAL_COMMUNITY): Payer: 59

## 2012-05-01 ENCOUNTER — Encounter (HOSPITAL_COMMUNITY): Payer: 59

## 2012-05-03 ENCOUNTER — Encounter (HOSPITAL_COMMUNITY): Payer: 59

## 2012-05-05 ENCOUNTER — Encounter (HOSPITAL_COMMUNITY): Payer: 59

## 2012-05-08 ENCOUNTER — Encounter (HOSPITAL_COMMUNITY): Payer: 59

## 2012-05-10 ENCOUNTER — Encounter (HOSPITAL_COMMUNITY): Payer: 59

## 2012-05-12 ENCOUNTER — Encounter (HOSPITAL_COMMUNITY): Payer: 59

## 2012-05-15 ENCOUNTER — Encounter (HOSPITAL_COMMUNITY): Payer: 59

## 2012-05-17 ENCOUNTER — Encounter (HOSPITAL_COMMUNITY): Payer: 59

## 2012-05-19 ENCOUNTER — Encounter (HOSPITAL_COMMUNITY): Payer: 59

## 2012-05-22 ENCOUNTER — Encounter (HOSPITAL_COMMUNITY): Payer: 59

## 2012-05-24 ENCOUNTER — Encounter (HOSPITAL_COMMUNITY): Payer: 59

## 2012-05-26 ENCOUNTER — Encounter (HOSPITAL_COMMUNITY): Payer: 59

## 2012-05-29 ENCOUNTER — Encounter (HOSPITAL_COMMUNITY): Payer: 59

## 2012-05-31 ENCOUNTER — Encounter (HOSPITAL_COMMUNITY): Payer: 59

## 2012-06-02 ENCOUNTER — Encounter (HOSPITAL_COMMUNITY): Payer: 59

## 2012-06-05 ENCOUNTER — Encounter (HOSPITAL_COMMUNITY): Payer: 59

## 2012-06-07 ENCOUNTER — Encounter (HOSPITAL_COMMUNITY): Payer: 59

## 2012-06-09 ENCOUNTER — Encounter (HOSPITAL_COMMUNITY): Payer: 59

## 2012-06-12 ENCOUNTER — Encounter (HOSPITAL_COMMUNITY): Payer: 59

## 2012-06-14 ENCOUNTER — Encounter (HOSPITAL_COMMUNITY): Payer: 59

## 2012-06-16 ENCOUNTER — Encounter (HOSPITAL_COMMUNITY): Payer: 59

## 2012-06-19 ENCOUNTER — Encounter (HOSPITAL_COMMUNITY): Payer: 59

## 2012-06-21 ENCOUNTER — Encounter (HOSPITAL_COMMUNITY): Payer: 59

## 2012-06-23 ENCOUNTER — Encounter (HOSPITAL_COMMUNITY): Payer: 59

## 2012-06-26 ENCOUNTER — Encounter (HOSPITAL_COMMUNITY): Payer: 59

## 2012-06-28 ENCOUNTER — Encounter (HOSPITAL_COMMUNITY): Payer: 59

## 2012-07-03 ENCOUNTER — Encounter (HOSPITAL_COMMUNITY): Payer: 59

## 2012-07-05 ENCOUNTER — Encounter (HOSPITAL_COMMUNITY): Payer: 59

## 2012-07-07 ENCOUNTER — Encounter (HOSPITAL_COMMUNITY): Payer: 59

## 2012-07-10 ENCOUNTER — Encounter (HOSPITAL_COMMUNITY): Payer: 59

## 2012-07-12 ENCOUNTER — Encounter (HOSPITAL_COMMUNITY): Payer: 59

## 2012-07-14 ENCOUNTER — Encounter (HOSPITAL_COMMUNITY): Payer: 59

## 2012-07-17 ENCOUNTER — Encounter (HOSPITAL_COMMUNITY): Payer: 59

## 2012-07-19 ENCOUNTER — Encounter (HOSPITAL_COMMUNITY): Payer: 59

## 2012-07-21 ENCOUNTER — Encounter (HOSPITAL_COMMUNITY): Payer: 59

## 2012-07-24 ENCOUNTER — Encounter (HOSPITAL_COMMUNITY): Payer: 59

## 2012-07-28 ENCOUNTER — Encounter (HOSPITAL_COMMUNITY): Payer: 59

## 2012-07-31 ENCOUNTER — Encounter (HOSPITAL_COMMUNITY): Payer: 59

## 2012-08-04 ENCOUNTER — Encounter (HOSPITAL_COMMUNITY): Payer: 59

## 2012-08-07 ENCOUNTER — Encounter (HOSPITAL_COMMUNITY): Payer: 59

## 2012-08-09 ENCOUNTER — Encounter (HOSPITAL_COMMUNITY): Payer: 59

## 2012-08-11 ENCOUNTER — Encounter (HOSPITAL_COMMUNITY): Payer: 59

## 2012-08-14 ENCOUNTER — Encounter (HOSPITAL_COMMUNITY): Payer: 59

## 2012-08-16 ENCOUNTER — Encounter (HOSPITAL_COMMUNITY): Payer: 59

## 2012-08-18 ENCOUNTER — Encounter (HOSPITAL_COMMUNITY): Payer: 59

## 2012-08-21 ENCOUNTER — Encounter (HOSPITAL_COMMUNITY): Payer: 59

## 2012-08-23 ENCOUNTER — Encounter (HOSPITAL_COMMUNITY): Payer: 59

## 2012-08-25 ENCOUNTER — Encounter (HOSPITAL_COMMUNITY): Payer: 59

## 2012-08-28 ENCOUNTER — Encounter (HOSPITAL_COMMUNITY): Payer: 59

## 2012-08-30 ENCOUNTER — Encounter (HOSPITAL_COMMUNITY): Payer: 59

## 2012-09-01 ENCOUNTER — Encounter (HOSPITAL_COMMUNITY): Payer: 59

## 2012-09-04 ENCOUNTER — Encounter (HOSPITAL_COMMUNITY): Payer: 59

## 2012-09-06 ENCOUNTER — Encounter (HOSPITAL_COMMUNITY): Payer: 59

## 2012-09-08 ENCOUNTER — Encounter (HOSPITAL_COMMUNITY): Payer: 59

## 2012-09-11 ENCOUNTER — Encounter (HOSPITAL_COMMUNITY): Payer: 59

## 2012-09-13 ENCOUNTER — Encounter (HOSPITAL_COMMUNITY): Payer: 59

## 2012-09-15 ENCOUNTER — Encounter (HOSPITAL_COMMUNITY): Payer: 59

## 2012-09-18 ENCOUNTER — Encounter (HOSPITAL_COMMUNITY): Payer: 59

## 2012-09-20 ENCOUNTER — Encounter (HOSPITAL_COMMUNITY): Payer: 59

## 2012-09-22 ENCOUNTER — Encounter (HOSPITAL_COMMUNITY): Payer: 59

## 2012-09-25 ENCOUNTER — Encounter (HOSPITAL_COMMUNITY): Payer: 59

## 2012-09-27 ENCOUNTER — Encounter (HOSPITAL_COMMUNITY): Payer: 59

## 2012-09-29 ENCOUNTER — Encounter (HOSPITAL_COMMUNITY): Payer: 59

## 2012-10-02 ENCOUNTER — Encounter (HOSPITAL_COMMUNITY): Payer: 59

## 2012-10-04 ENCOUNTER — Encounter (HOSPITAL_COMMUNITY): Payer: 59

## 2012-10-06 ENCOUNTER — Encounter (HOSPITAL_COMMUNITY): Payer: 59

## 2012-10-09 ENCOUNTER — Encounter (HOSPITAL_COMMUNITY): Payer: 59

## 2012-10-11 ENCOUNTER — Encounter (HOSPITAL_COMMUNITY): Payer: 59

## 2012-10-13 ENCOUNTER — Encounter (HOSPITAL_COMMUNITY): Payer: 59

## 2012-10-16 ENCOUNTER — Encounter (HOSPITAL_COMMUNITY): Payer: 59

## 2012-10-18 ENCOUNTER — Encounter (HOSPITAL_COMMUNITY): Payer: 59

## 2012-10-20 ENCOUNTER — Encounter (HOSPITAL_COMMUNITY): Payer: 59

## 2012-10-23 ENCOUNTER — Encounter (HOSPITAL_COMMUNITY): Payer: 59

## 2012-10-25 ENCOUNTER — Encounter (HOSPITAL_COMMUNITY): Payer: 59

## 2012-10-27 ENCOUNTER — Encounter (HOSPITAL_COMMUNITY): Payer: 59

## 2012-10-30 ENCOUNTER — Encounter (HOSPITAL_COMMUNITY): Payer: 59

## 2012-11-01 ENCOUNTER — Encounter (HOSPITAL_COMMUNITY): Payer: 59

## 2012-11-03 ENCOUNTER — Encounter (HOSPITAL_COMMUNITY): Payer: 59

## 2012-11-06 ENCOUNTER — Encounter (HOSPITAL_COMMUNITY): Payer: 59

## 2012-11-08 ENCOUNTER — Encounter (HOSPITAL_COMMUNITY): Payer: 59

## 2012-11-10 ENCOUNTER — Encounter (HOSPITAL_COMMUNITY): Payer: 59

## 2012-11-13 ENCOUNTER — Encounter (HOSPITAL_COMMUNITY): Payer: 59

## 2012-11-15 ENCOUNTER — Encounter (HOSPITAL_COMMUNITY): Payer: 59

## 2012-11-17 ENCOUNTER — Encounter (HOSPITAL_COMMUNITY): Payer: 59

## 2012-11-20 ENCOUNTER — Encounter (HOSPITAL_COMMUNITY): Payer: 59

## 2012-11-22 ENCOUNTER — Encounter (HOSPITAL_COMMUNITY): Payer: 59

## 2012-11-24 ENCOUNTER — Encounter (HOSPITAL_COMMUNITY): Payer: 59

## 2012-11-27 ENCOUNTER — Encounter (HOSPITAL_COMMUNITY): Payer: 59

## 2012-11-29 ENCOUNTER — Encounter (HOSPITAL_COMMUNITY): Payer: 59

## 2012-12-01 ENCOUNTER — Encounter (HOSPITAL_COMMUNITY): Payer: 59

## 2012-12-04 ENCOUNTER — Encounter (HOSPITAL_COMMUNITY): Payer: 59

## 2012-12-06 ENCOUNTER — Encounter (HOSPITAL_COMMUNITY): Payer: 59

## 2012-12-08 ENCOUNTER — Encounter (HOSPITAL_COMMUNITY): Payer: 59

## 2012-12-11 ENCOUNTER — Encounter (HOSPITAL_COMMUNITY): Payer: 59

## 2012-12-13 ENCOUNTER — Encounter (HOSPITAL_COMMUNITY): Payer: 59

## 2012-12-15 ENCOUNTER — Encounter (HOSPITAL_COMMUNITY): Payer: 59

## 2012-12-18 ENCOUNTER — Encounter (HOSPITAL_COMMUNITY): Payer: 59

## 2012-12-20 ENCOUNTER — Encounter (HOSPITAL_COMMUNITY): Payer: 59

## 2012-12-22 ENCOUNTER — Encounter (HOSPITAL_COMMUNITY): Payer: 59

## 2012-12-27 ENCOUNTER — Encounter (HOSPITAL_COMMUNITY): Payer: 59

## 2012-12-29 ENCOUNTER — Encounter (HOSPITAL_COMMUNITY): Payer: 59

## 2013-01-01 ENCOUNTER — Encounter (HOSPITAL_COMMUNITY): Payer: 59

## 2013-01-03 ENCOUNTER — Encounter (HOSPITAL_COMMUNITY): Payer: 59

## 2013-01-05 ENCOUNTER — Encounter (HOSPITAL_COMMUNITY): Payer: 59

## 2013-01-08 ENCOUNTER — Encounter (HOSPITAL_COMMUNITY): Payer: 59

## 2013-01-10 ENCOUNTER — Encounter (HOSPITAL_COMMUNITY): Payer: 59

## 2013-01-12 ENCOUNTER — Encounter (HOSPITAL_COMMUNITY): Payer: 59

## 2013-01-15 ENCOUNTER — Encounter (HOSPITAL_COMMUNITY): Payer: 59

## 2013-01-17 ENCOUNTER — Encounter (HOSPITAL_COMMUNITY): Payer: 59

## 2013-01-19 ENCOUNTER — Encounter (HOSPITAL_COMMUNITY): Payer: 59

## 2013-01-22 ENCOUNTER — Encounter (HOSPITAL_COMMUNITY): Payer: 59

## 2013-01-24 ENCOUNTER — Encounter (HOSPITAL_COMMUNITY): Payer: 59

## 2013-01-26 ENCOUNTER — Encounter (HOSPITAL_COMMUNITY): Payer: 59

## 2013-01-29 ENCOUNTER — Encounter (HOSPITAL_COMMUNITY): Payer: 59

## 2013-03-16 IMAGING — CR DG CHEST 2V
2 series · 2 of 2 positions shown · non-contrast
Comparison: None.

CLINICAL DATA: Coronary artery disease, cough, posterior chest
pain, smoker, hypertension, pre cath respiratory exam.

CHEST - 2 VIEW

[w chest pa]
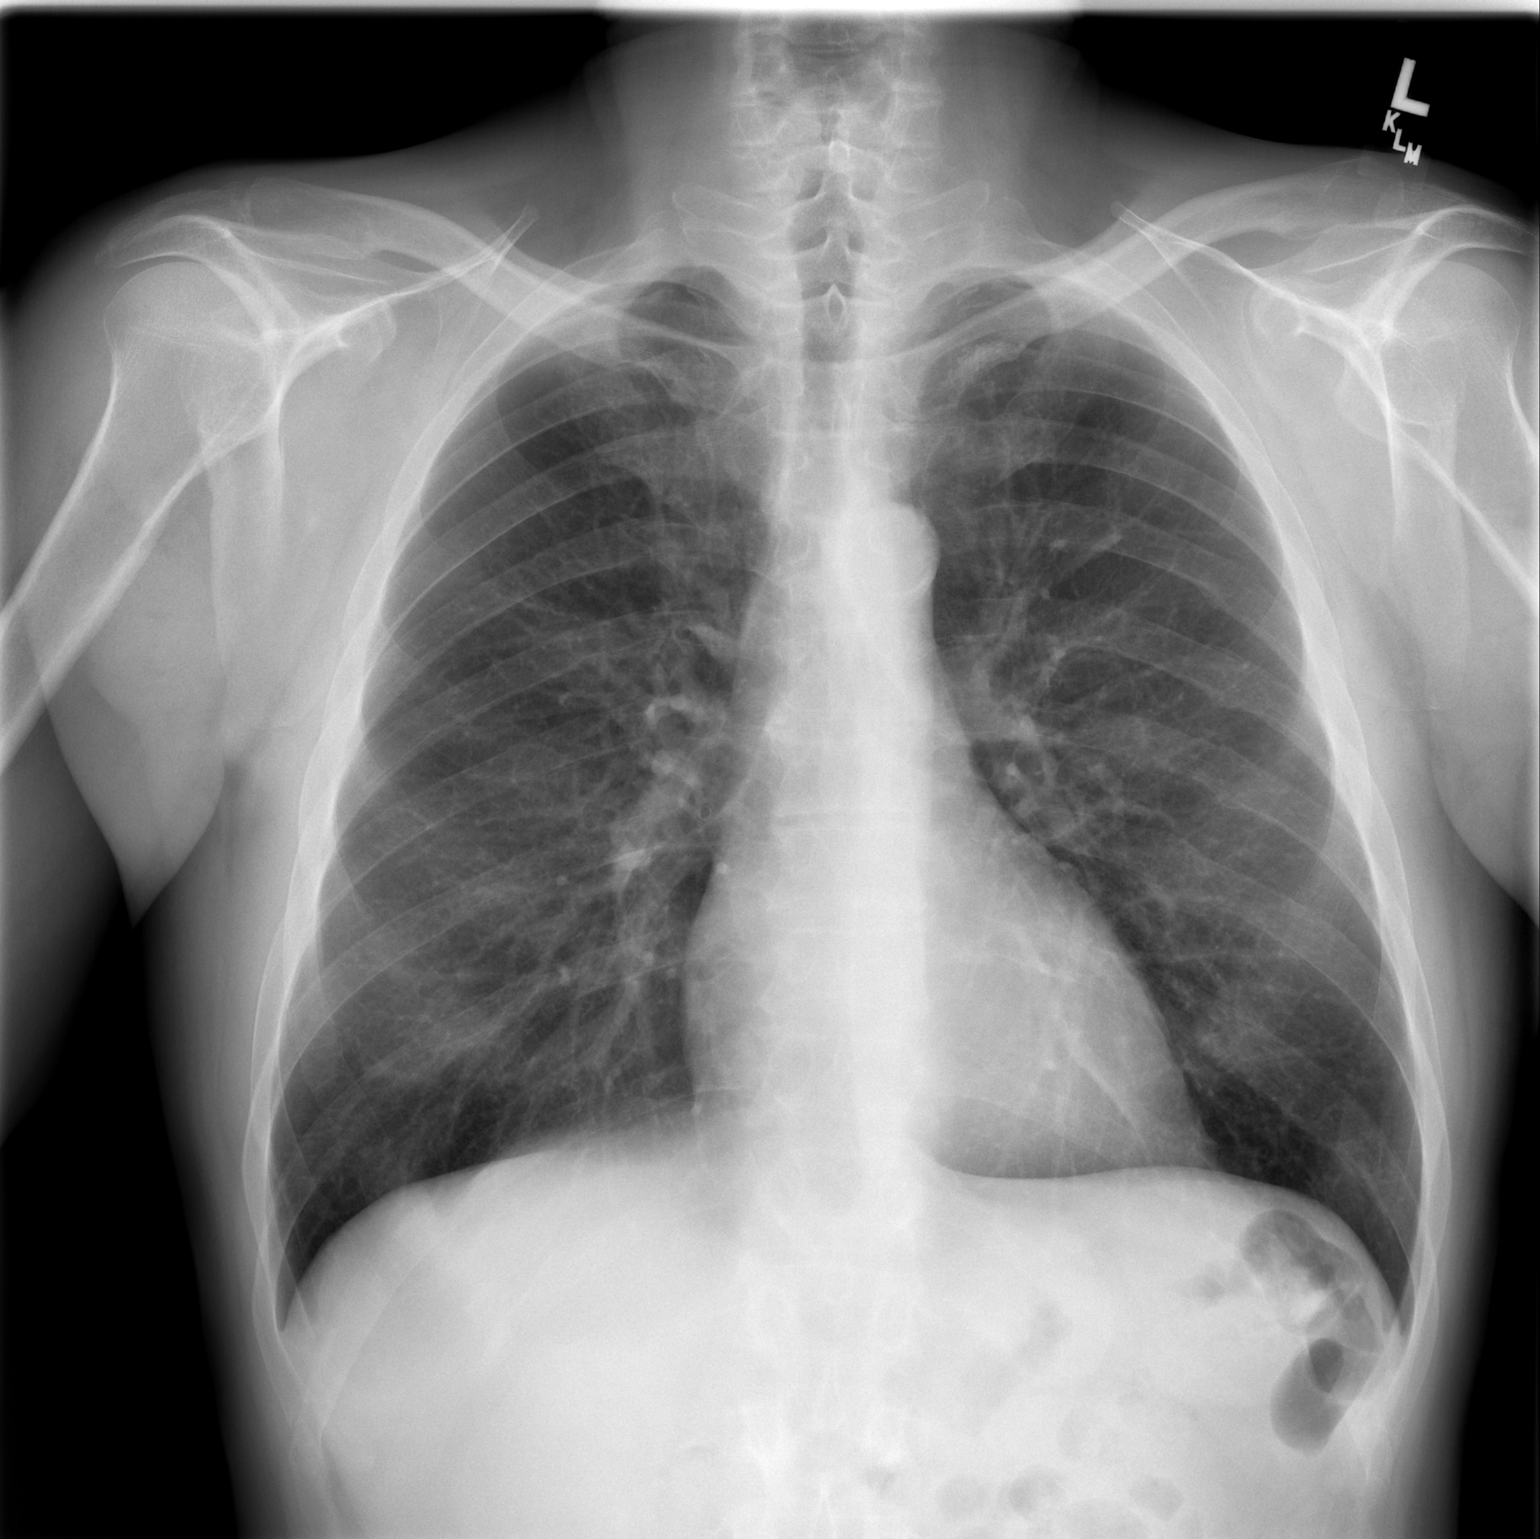

[w chest lat]
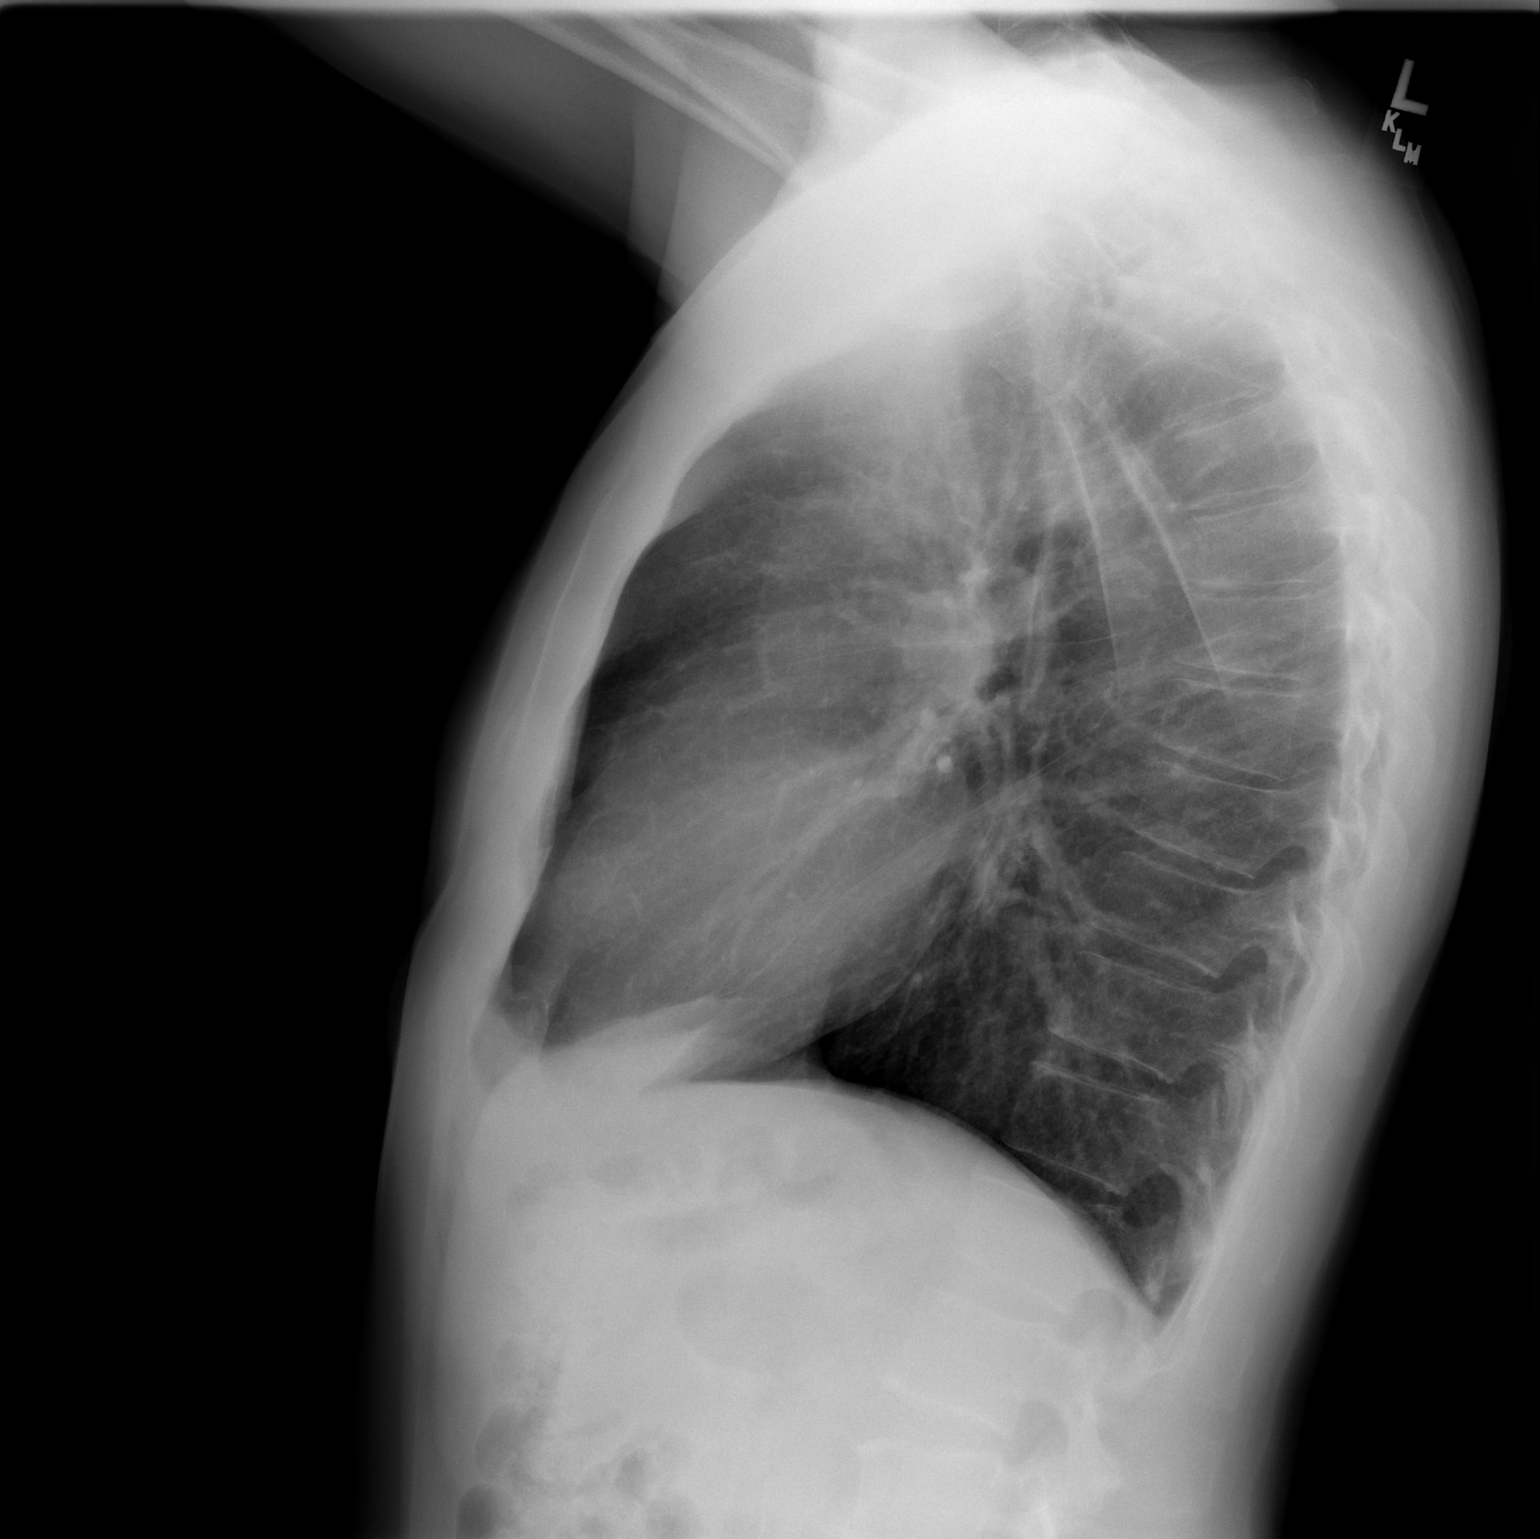

[2 of 2 positions shown; findings below may reference images not displayed]

FINDINGS: Lungs are clear.

Heart size normal.

Thoracic aortic arch slightly calcified of normal caliber.

Mediastinum, hila, pleura and osseous structures appear normal for
age.
IMPRESSION: No active cardiopulmonary disease.

## 2013-03-21 IMAGING — CR DG CHEST 2V
2 series · 2 of 2 positions shown · non-contrast
Comparison: Two-view chest radiograph 01/18/2011

CLINICAL DATA: Preop for CABG

CHEST - 2 VIEW

[w chest pa]
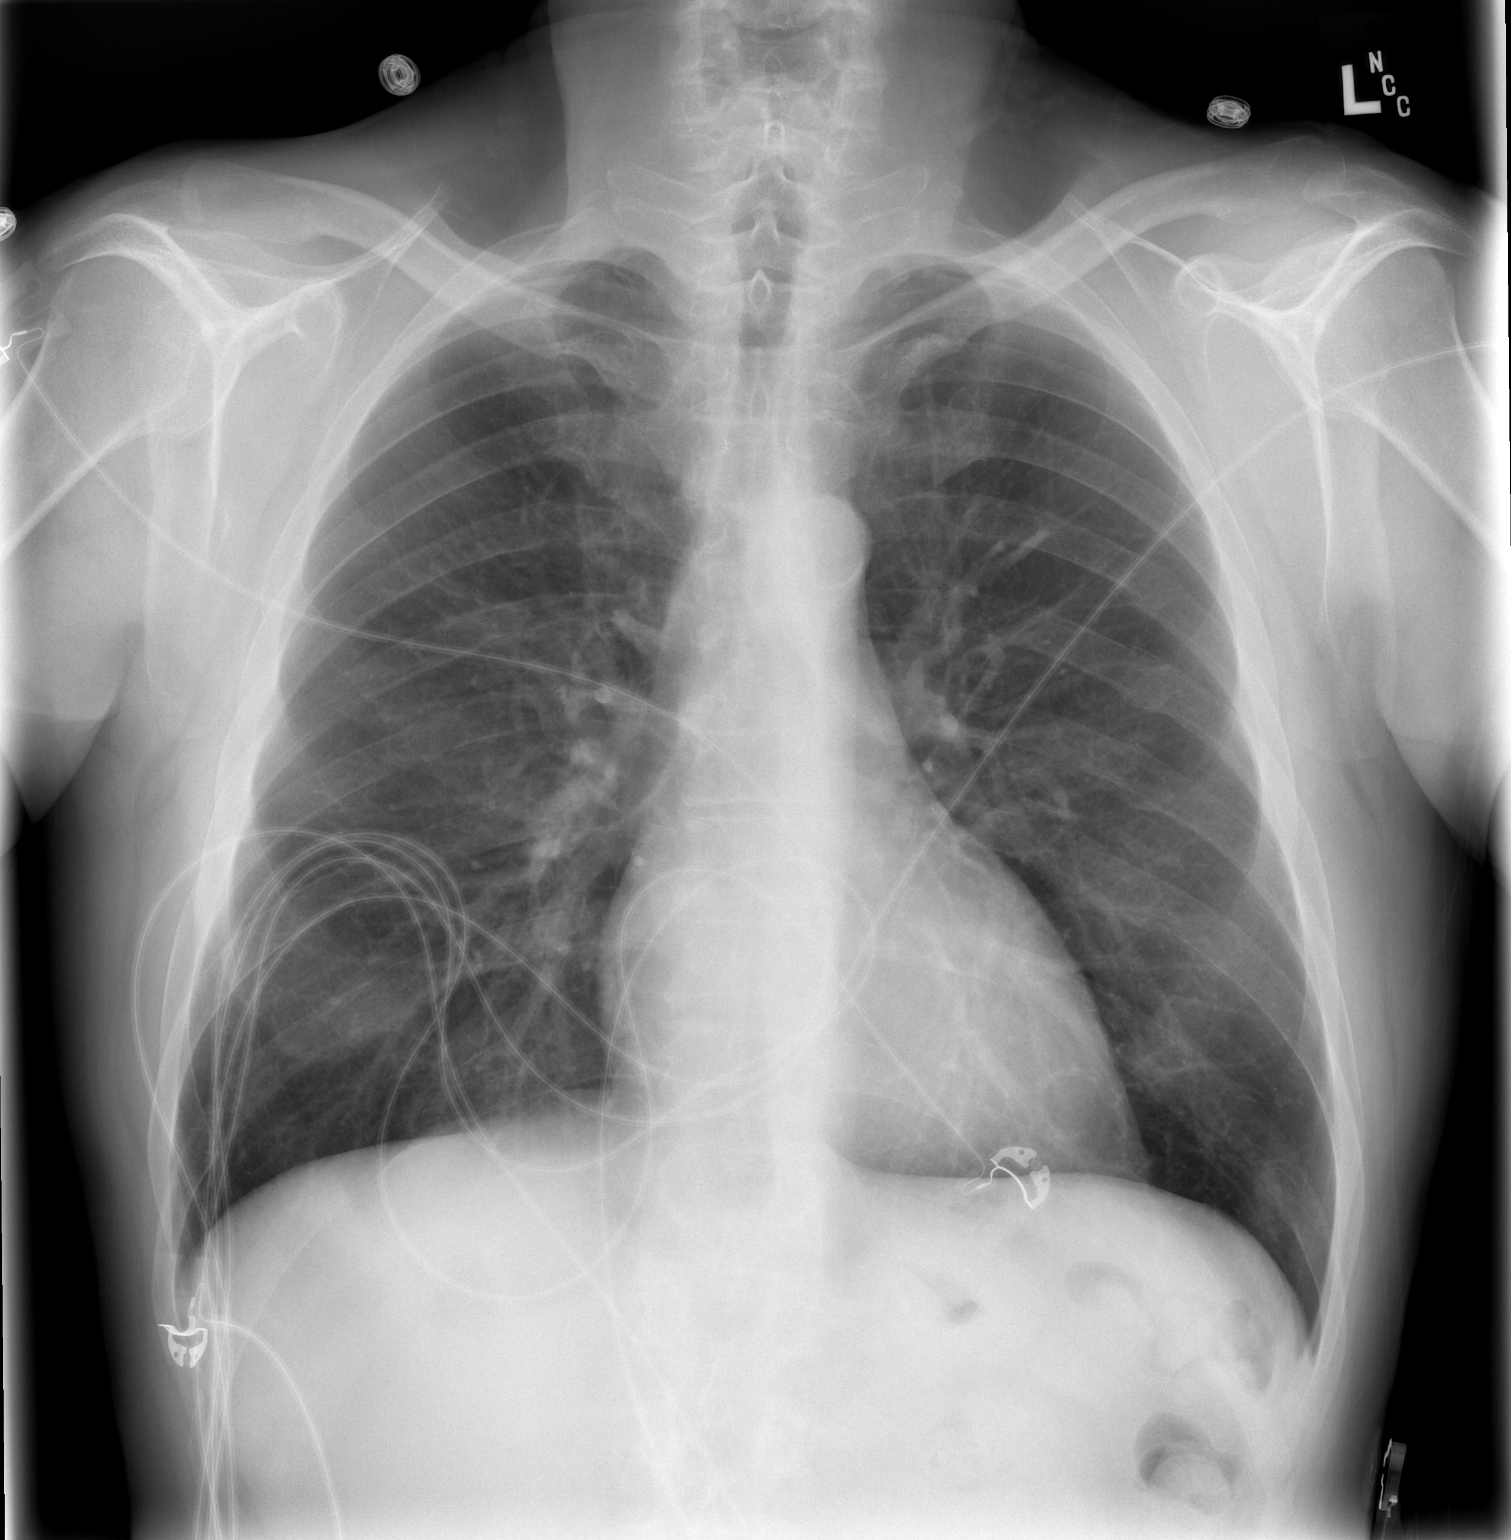

[w chest lat]
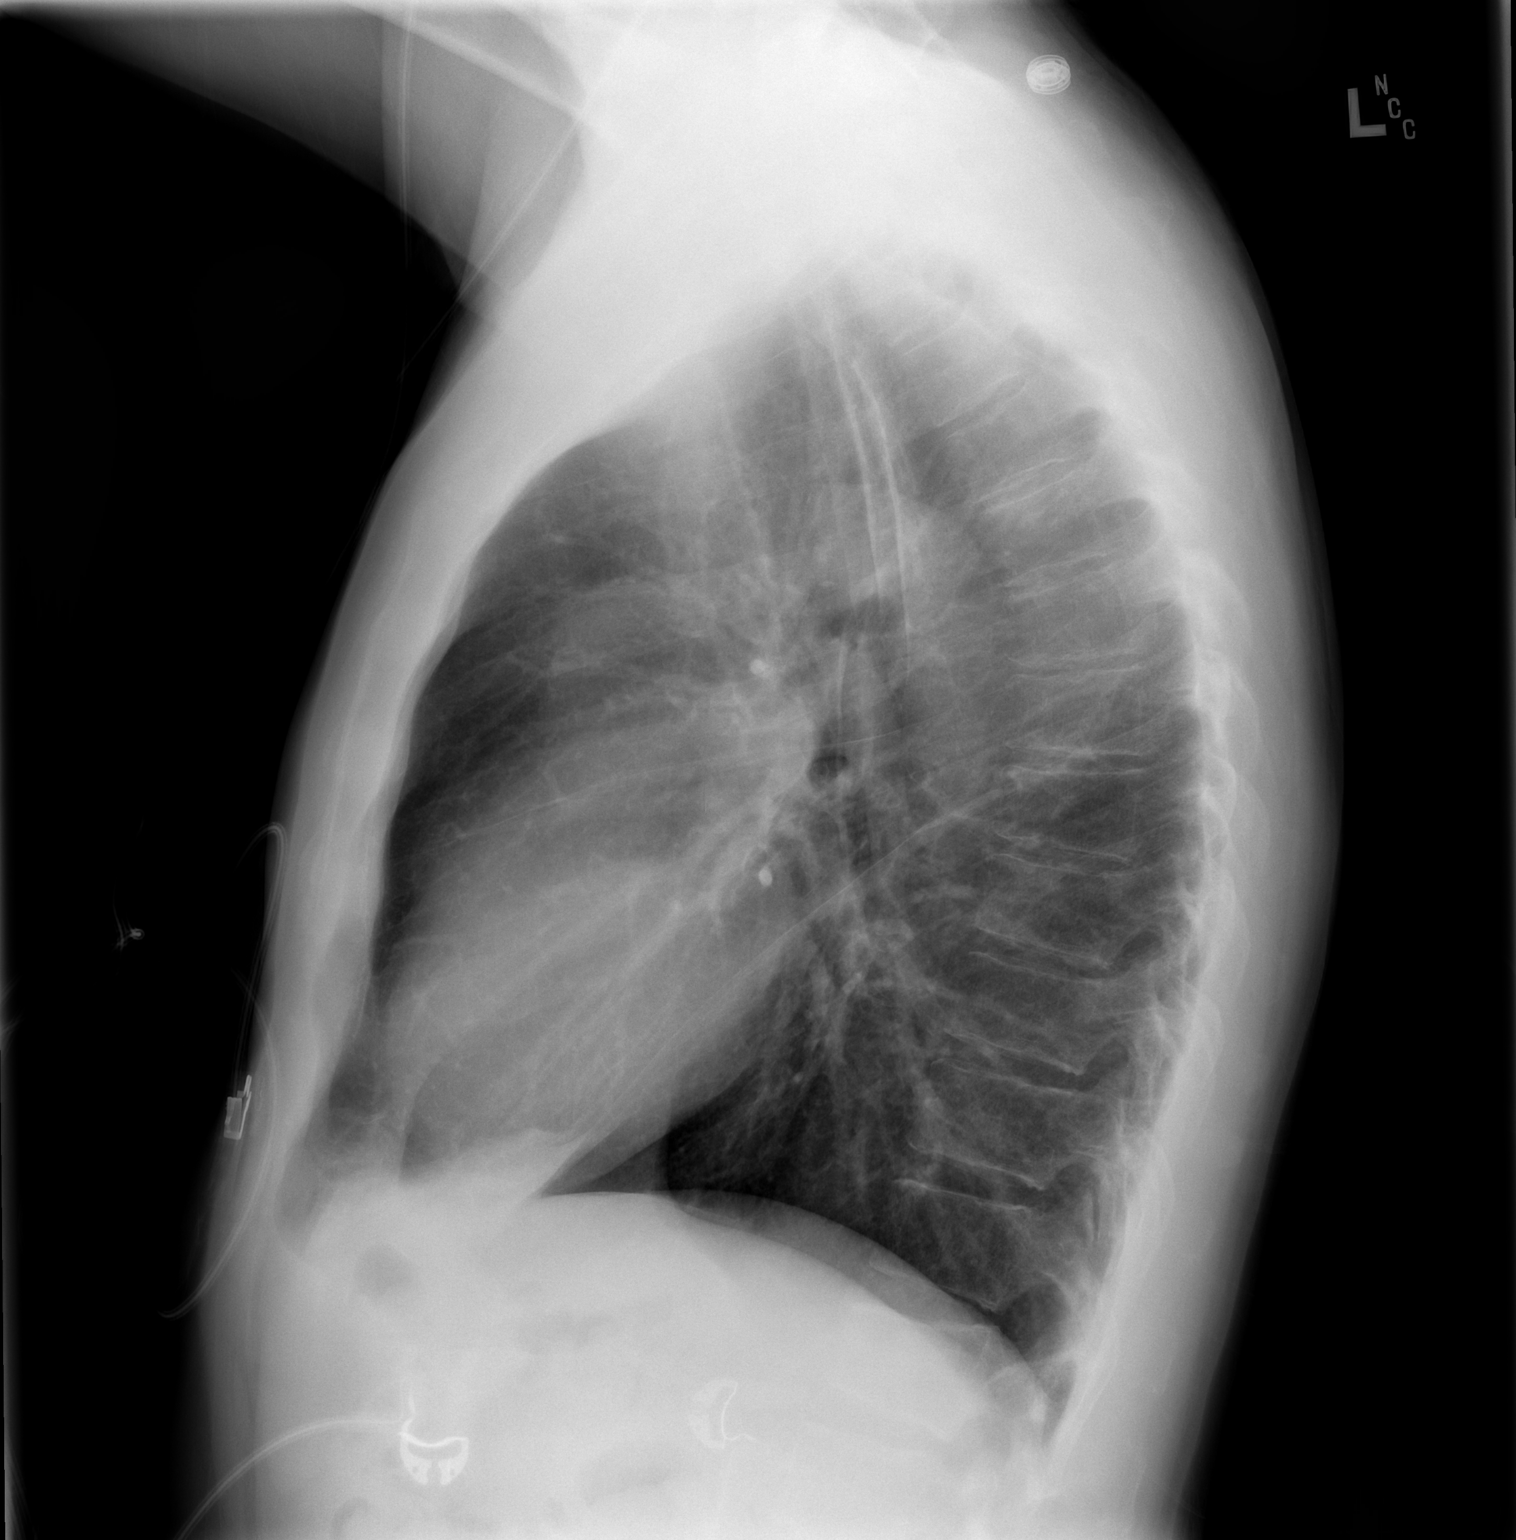

[2 of 2 positions shown; findings below may reference images not displayed]

FINDINGS: The heart, mediastinal, and hilar contours are normal.
Mild atherosclerotic calcification transverse aorta.  Normal
pulmonary vascularity.  The lungs are normally expanded and clear.
Midline trachea.  No acute bony abnormality.
IMPRESSION: No acute cardiopulmonary disease.

## 2016-05-14 ENCOUNTER — Other Ambulatory Visit (HOSPITAL_COMMUNITY): Payer: Self-pay | Admitting: Internal Medicine

## 2016-05-14 ENCOUNTER — Ambulatory Visit (HOSPITAL_COMMUNITY)
Admission: RE | Admit: 2016-05-14 | Discharge: 2016-05-14 | Disposition: A | Discharge status: Home / Self Care | Payer: 59 | Source: Ambulatory Visit | Attending: Vascular Surgery | Admitting: Vascular Surgery

## 2016-05-14 DIAGNOSIS — R0989 Other specified symptoms and signs involving the circulatory and respiratory systems: Secondary | ICD-10-CM | POA: Diagnosis present

## 2016-05-14 LAB — VAS US CAROTID
LEFT ECA DIAS: -15 cm/s
LICADDIAS: -24 cm/s
LICADSYS: -75 cm/s
LICAPDIAS: 12 cm/s
LICAPSYS: 59 cm/s
Left CCA dist dias: -31 cm/s
Left CCA dist sys: -122 cm/s
Left CCA prox dias: 22 cm/s
Left CCA prox sys: 96 cm/s
RCCAPDIAS: 19 cm/s
RIGHT CCA MID DIAS: 18 cm/s
RIGHT ECA DIAS: -14 cm/s
Right CCA prox sys: 98 cm/s
Right cca dist sys: -89 cm/s

## 2017-06-06 DIAGNOSIS — Z1212 Encounter for screening for malignant neoplasm of rectum: Secondary | ICD-10-CM | POA: Diagnosis not present

## 2017-11-02 DIAGNOSIS — R69 Illness, unspecified: Secondary | ICD-10-CM | POA: Diagnosis not present

## 2017-12-21 DIAGNOSIS — R69 Illness, unspecified: Secondary | ICD-10-CM | POA: Diagnosis not present

## 2018-06-27 DIAGNOSIS — I10 Essential (primary) hypertension: Secondary | ICD-10-CM | POA: Diagnosis not present

## 2018-06-27 DIAGNOSIS — Z125 Encounter for screening for malignant neoplasm of prostate: Secondary | ICD-10-CM | POA: Diagnosis not present

## 2018-06-27 DIAGNOSIS — E538 Deficiency of other specified B group vitamins: Secondary | ICD-10-CM | POA: Diagnosis not present

## 2018-06-27 DIAGNOSIS — R7301 Impaired fasting glucose: Secondary | ICD-10-CM | POA: Diagnosis not present

## 2018-06-27 DIAGNOSIS — R82998 Other abnormal findings in urine: Secondary | ICD-10-CM | POA: Diagnosis not present

## 2018-06-27 DIAGNOSIS — E7849 Other hyperlipidemia: Secondary | ICD-10-CM | POA: Diagnosis not present

## 2018-07-04 DIAGNOSIS — I2510 Atherosclerotic heart disease of native coronary artery without angina pectoris: Secondary | ICD-10-CM | POA: Diagnosis not present

## 2018-07-04 DIAGNOSIS — K4090 Unilateral inguinal hernia, without obstruction or gangrene, not specified as recurrent: Secondary | ICD-10-CM | POA: Diagnosis not present

## 2018-07-04 DIAGNOSIS — R0989 Other specified symptoms and signs involving the circulatory and respiratory systems: Secondary | ICD-10-CM | POA: Diagnosis not present

## 2018-07-04 DIAGNOSIS — E538 Deficiency of other specified B group vitamins: Secondary | ICD-10-CM | POA: Diagnosis not present

## 2018-07-04 DIAGNOSIS — R030 Elevated blood-pressure reading, without diagnosis of hypertension: Secondary | ICD-10-CM | POA: Diagnosis not present

## 2018-07-04 DIAGNOSIS — E7849 Other hyperlipidemia: Secondary | ICD-10-CM | POA: Diagnosis not present

## 2018-07-04 DIAGNOSIS — Z0000 Encounter for general adult medical examination without abnormal findings: Secondary | ICD-10-CM | POA: Diagnosis not present

## 2018-07-04 DIAGNOSIS — N528 Other male erectile dysfunction: Secondary | ICD-10-CM | POA: Diagnosis not present

## 2018-07-04 DIAGNOSIS — R7301 Impaired fasting glucose: Secondary | ICD-10-CM | POA: Diagnosis not present

## 2018-07-04 DIAGNOSIS — Z23 Encounter for immunization: Secondary | ICD-10-CM | POA: Diagnosis not present

## 2018-07-04 DIAGNOSIS — L570 Actinic keratosis: Secondary | ICD-10-CM | POA: Diagnosis not present

## 2019-03-05 DIAGNOSIS — R69 Illness, unspecified: Secondary | ICD-10-CM | POA: Diagnosis not present

## 2019-04-19 DIAGNOSIS — Z23 Encounter for immunization: Secondary | ICD-10-CM | POA: Diagnosis not present

## 2019-08-02 DIAGNOSIS — I10 Essential (primary) hypertension: Secondary | ICD-10-CM | POA: Diagnosis not present

## 2019-08-02 DIAGNOSIS — E7849 Other hyperlipidemia: Secondary | ICD-10-CM | POA: Diagnosis not present

## 2019-08-02 DIAGNOSIS — E538 Deficiency of other specified B group vitamins: Secondary | ICD-10-CM | POA: Diagnosis not present

## 2019-08-02 DIAGNOSIS — R7301 Impaired fasting glucose: Secondary | ICD-10-CM | POA: Diagnosis not present

## 2019-08-02 DIAGNOSIS — Z125 Encounter for screening for malignant neoplasm of prostate: Secondary | ICD-10-CM | POA: Diagnosis not present

## 2019-08-07 DIAGNOSIS — R82998 Other abnormal findings in urine: Secondary | ICD-10-CM | POA: Diagnosis not present

## 2019-08-07 DIAGNOSIS — I10 Essential (primary) hypertension: Secondary | ICD-10-CM | POA: Diagnosis not present

## 2019-08-10 DIAGNOSIS — Z1212 Encounter for screening for malignant neoplasm of rectum: Secondary | ICD-10-CM | POA: Diagnosis not present

## 2019-08-10 DIAGNOSIS — L570 Actinic keratosis: Secondary | ICD-10-CM | POA: Diagnosis not present

## 2019-08-10 DIAGNOSIS — R0989 Other specified symptoms and signs involving the circulatory and respiratory systems: Secondary | ICD-10-CM | POA: Diagnosis not present

## 2019-08-10 DIAGNOSIS — I2510 Atherosclerotic heart disease of native coronary artery without angina pectoris: Secondary | ICD-10-CM | POA: Diagnosis not present

## 2019-08-10 DIAGNOSIS — Z0000 Encounter for general adult medical examination without abnormal findings: Secondary | ICD-10-CM | POA: Diagnosis not present

## 2019-08-10 DIAGNOSIS — M542 Cervicalgia: Secondary | ICD-10-CM | POA: Diagnosis not present

## 2019-08-10 DIAGNOSIS — R7301 Impaired fasting glucose: Secondary | ICD-10-CM | POA: Diagnosis not present

## 2019-08-10 DIAGNOSIS — M6530 Trigger finger, unspecified finger: Secondary | ICD-10-CM | POA: Diagnosis not present

## 2019-08-10 DIAGNOSIS — E785 Hyperlipidemia, unspecified: Secondary | ICD-10-CM | POA: Diagnosis not present

## 2019-08-10 DIAGNOSIS — K4090 Unilateral inguinal hernia, without obstruction or gangrene, not specified as recurrent: Secondary | ICD-10-CM | POA: Diagnosis not present

## 2019-08-10 DIAGNOSIS — R030 Elevated blood-pressure reading, without diagnosis of hypertension: Secondary | ICD-10-CM | POA: Diagnosis not present

## 2019-10-23 DIAGNOSIS — Z1211 Encounter for screening for malignant neoplasm of colon: Secondary | ICD-10-CM | POA: Diagnosis not present

## 2019-10-23 DIAGNOSIS — Z1212 Encounter for screening for malignant neoplasm of rectum: Secondary | ICD-10-CM | POA: Diagnosis not present

## 2020-03-10 DIAGNOSIS — R69 Illness, unspecified: Secondary | ICD-10-CM | POA: Diagnosis not present

## 2020-03-19 DIAGNOSIS — R69 Illness, unspecified: Secondary | ICD-10-CM | POA: Diagnosis not present

## 2020-05-14 DIAGNOSIS — R69 Illness, unspecified: Secondary | ICD-10-CM | POA: Diagnosis not present

## 2020-08-06 DIAGNOSIS — Z125 Encounter for screening for malignant neoplasm of prostate: Secondary | ICD-10-CM | POA: Diagnosis not present

## 2020-08-06 DIAGNOSIS — R7301 Impaired fasting glucose: Secondary | ICD-10-CM | POA: Diagnosis not present

## 2020-08-06 DIAGNOSIS — E785 Hyperlipidemia, unspecified: Secondary | ICD-10-CM | POA: Diagnosis not present

## 2020-08-06 DIAGNOSIS — E538 Deficiency of other specified B group vitamins: Secondary | ICD-10-CM | POA: Diagnosis not present

## 2020-08-13 DIAGNOSIS — R0989 Other specified symptoms and signs involving the circulatory and respiratory systems: Secondary | ICD-10-CM | POA: Diagnosis not present

## 2020-08-13 DIAGNOSIS — N529 Male erectile dysfunction, unspecified: Secondary | ICD-10-CM | POA: Diagnosis not present

## 2020-08-13 DIAGNOSIS — R972 Elevated prostate specific antigen [PSA]: Secondary | ICD-10-CM | POA: Diagnosis not present

## 2020-08-13 DIAGNOSIS — Z8249 Family history of ischemic heart disease and other diseases of the circulatory system: Secondary | ICD-10-CM | POA: Diagnosis not present

## 2020-08-13 DIAGNOSIS — I2510 Atherosclerotic heart disease of native coronary artery without angina pectoris: Secondary | ICD-10-CM | POA: Diagnosis not present

## 2020-08-13 DIAGNOSIS — M6530 Trigger finger, unspecified finger: Secondary | ICD-10-CM | POA: Diagnosis not present

## 2020-08-13 DIAGNOSIS — Z1331 Encounter for screening for depression: Secondary | ICD-10-CM | POA: Diagnosis not present

## 2020-08-13 DIAGNOSIS — Z0000 Encounter for general adult medical examination without abnormal findings: Secondary | ICD-10-CM | POA: Diagnosis not present

## 2020-08-13 DIAGNOSIS — R82998 Other abnormal findings in urine: Secondary | ICD-10-CM | POA: Diagnosis not present

## 2020-08-13 DIAGNOSIS — I10 Essential (primary) hypertension: Secondary | ICD-10-CM | POA: Diagnosis not present

## 2020-08-13 DIAGNOSIS — Z23 Encounter for immunization: Secondary | ICD-10-CM | POA: Diagnosis not present

## 2020-08-13 DIAGNOSIS — R9431 Abnormal electrocardiogram [ECG] [EKG]: Secondary | ICD-10-CM | POA: Diagnosis not present

## 2020-08-13 DIAGNOSIS — E785 Hyperlipidemia, unspecified: Secondary | ICD-10-CM | POA: Diagnosis not present

## 2020-08-14 DIAGNOSIS — Z23 Encounter for immunization: Secondary | ICD-10-CM | POA: Diagnosis not present

## 2021-01-02 DIAGNOSIS — Z125 Encounter for screening for malignant neoplasm of prostate: Secondary | ICD-10-CM | POA: Diagnosis not present

## 2021-05-07 DIAGNOSIS — Z23 Encounter for immunization: Secondary | ICD-10-CM | POA: Diagnosis not present

## 2021-09-16 DIAGNOSIS — E538 Deficiency of other specified B group vitamins: Secondary | ICD-10-CM | POA: Diagnosis not present

## 2021-09-16 DIAGNOSIS — E785 Hyperlipidemia, unspecified: Secondary | ICD-10-CM | POA: Diagnosis not present

## 2021-09-16 DIAGNOSIS — Z125 Encounter for screening for malignant neoplasm of prostate: Secondary | ICD-10-CM | POA: Diagnosis not present

## 2021-09-16 DIAGNOSIS — R7301 Impaired fasting glucose: Secondary | ICD-10-CM | POA: Diagnosis not present

## 2021-09-16 DIAGNOSIS — I10 Essential (primary) hypertension: Secondary | ICD-10-CM | POA: Diagnosis not present

## 2021-11-12 DIAGNOSIS — R69 Illness, unspecified: Secondary | ICD-10-CM | POA: Diagnosis not present

## 2021-11-12 DIAGNOSIS — Z1339 Encounter for screening examination for other mental health and behavioral disorders: Secondary | ICD-10-CM | POA: Diagnosis not present

## 2021-11-12 DIAGNOSIS — Z0000 Encounter for general adult medical examination without abnormal findings: Secondary | ICD-10-CM | POA: Diagnosis not present

## 2021-11-12 DIAGNOSIS — Z23 Encounter for immunization: Secondary | ICD-10-CM | POA: Diagnosis not present

## 2021-11-12 DIAGNOSIS — E785 Hyperlipidemia, unspecified: Secondary | ICD-10-CM | POA: Diagnosis not present

## 2021-11-12 DIAGNOSIS — Z1331 Encounter for screening for depression: Secondary | ICD-10-CM | POA: Diagnosis not present

## 2021-11-12 DIAGNOSIS — I2510 Atherosclerotic heart disease of native coronary artery without angina pectoris: Secondary | ICD-10-CM | POA: Diagnosis not present

## 2021-11-12 DIAGNOSIS — R7301 Impaired fasting glucose: Secondary | ICD-10-CM | POA: Diagnosis not present

## 2021-11-12 DIAGNOSIS — E538 Deficiency of other specified B group vitamins: Secondary | ICD-10-CM | POA: Diagnosis not present

## 2021-11-12 DIAGNOSIS — R9431 Abnormal electrocardiogram [ECG] [EKG]: Secondary | ICD-10-CM | POA: Diagnosis not present

## 2021-11-12 DIAGNOSIS — R82998 Other abnormal findings in urine: Secondary | ICD-10-CM | POA: Diagnosis not present

## 2021-11-12 DIAGNOSIS — I10 Essential (primary) hypertension: Secondary | ICD-10-CM | POA: Diagnosis not present

## 2021-11-12 DIAGNOSIS — M6530 Trigger finger, unspecified finger: Secondary | ICD-10-CM | POA: Diagnosis not present

## 2021-11-12 DIAGNOSIS — R0989 Other specified symptoms and signs involving the circulatory and respiratory systems: Secondary | ICD-10-CM | POA: Diagnosis not present

## 2021-11-13 ENCOUNTER — Other Ambulatory Visit: Payer: Self-pay | Admitting: Internal Medicine

## 2021-11-13 DIAGNOSIS — F17200 Nicotine dependence, unspecified, uncomplicated: Secondary | ICD-10-CM

## 2021-11-23 DIAGNOSIS — M79642 Pain in left hand: Secondary | ICD-10-CM | POA: Diagnosis not present

## 2021-11-23 DIAGNOSIS — M79641 Pain in right hand: Secondary | ICD-10-CM | POA: Diagnosis not present

## 2021-11-23 DIAGNOSIS — M720 Palmar fascial fibromatosis [Dupuytren]: Secondary | ICD-10-CM | POA: Diagnosis not present

## 2021-12-01 ENCOUNTER — Ambulatory Visit: Payer: Medicare HMO | Admitting: Internal Medicine

## 2021-12-01 ENCOUNTER — Encounter: Payer: Self-pay | Admitting: Internal Medicine

## 2021-12-01 VITALS — BP 126/72 | HR 55 | Ht 66.0 in | Wt 179.4 lb

## 2021-12-01 DIAGNOSIS — E785 Hyperlipidemia, unspecified: Secondary | ICD-10-CM | POA: Diagnosis not present

## 2021-12-01 MED ORDER — ATORVASTATIN CALCIUM 40 MG PO TABS: 40.0000 mg | ORAL_TABLET | Freq: Every day | ORAL | 3 refills | Status: AC

## 2021-12-01 NOTE — Progress Notes (Signed)
?Cardiology Office Note:   ? ?Date:  12/01/2021  ? ?ID:  Mason Conley, DOB Dec 08, 1950, MRN [id number] ? ?PCP:  No primary care provider on file. ?  ?[facility] Providers ?Cardiologist:  None    ? ?Referring MD: Rodrigo Ran, MD  ? ?No chief complaint on file. ?Hx of CAD ? ?History of Present Illness:   ? ?Mason Conley is a 71 y.o. male with a hx of HTN, HLD, long-term smoker, family hx of CAD,  hx of CAD and CABG referral for follow up. ? ?Cath with Dr. Herbie Baltimore in 2012. He had prior echo which showed EF 50-55 with mild to moderate anterior wall hypokinesis. Myoview showed moderate perfusion defect with peri-infarct ischemia in the mid anterior septal and anterior apical regions. Had cath. Complicated by vasospasm peripherally requiring verapamil. Cath demonstrated LM- 30-40% stenosis. LAD ostial 95% stenosis with mid 70% to 80% stenosis.  Ostial OM v ramus had 70% lesion with tandem 50-60% lesion. PDA had 80% lesion. With multivessel dx CT surgery was consulted. Underwent CABGx3 vessel (LIMA-LAD, RIMA-RI, SVG-PDA) ? ?He sees Dr. Waynard Edwards. Referral was meant for patient to be follow up with Dr. Herbie Baltimore. He was noted to have an abnormal EKG. I don't have the EKG. ? ?09/16/2021 TC- 188 mg/dL, LDL 382 mg/dL, HDL 61 mg/dL ? ?Normal renal fxn. TSH normal. ? ?Since this time he has followed with Dr. Waynard Edwards. He spends his time with his mom who has demenita. No chest pain or SOB. He mows 2 lawns every week with a push lawnmower.  He's on atorvastatin 20 mg and zetia 10 mg. No hospitalizations. No orthopnea, PND or LE edema.  ? ?Current Medications: ?Current Outpatient Medications on File Prior to Visit  ?Medication Sig Dispense Refill  ? aspirin 325 MG EC tablet Take 325 mg by mouth daily.    ? atorvastatin (LIPITOR) 20 MG tablet Take 20 mg by mouth daily.    ? Cholecalciferol (VITAMIN D3) 1000 UNITS CAPS Take 1,000 Units by mouth daily.    ? ezetimibe (ZETIA) 10 MG tablet Take 1 tablet by mouth daily.    ?  lisinopril (PRINIVIL,ZESTRIL) 10 MG tablet     ? metoprolol tartrate (LOPRESSOR) 25 MG tablet     ? ?No current facility-administered medications on file prior to visit.  ? ? ?Allergies:   Patient has no known allergies.  ? ?Social History  ? ?Socioeconomic History  ? Marital status: Divorced  ?  Spouse name: Not on file  ? Number of children: Not on file  ? Years of education: Not on file  ? Highest education level: Not on file  ?Occupational History  ? Not on file  ?Tobacco Use  ? Smoking status: Former  ?  Packs/day: 1.50  ?  Years: 20.00  ?  Pack years: 30.00  ?  Types: Cigarettes  ?  Quit date: 01/26/2011  ?  Years since quitting: 10.8  ? Smokeless tobacco: Not on file  ?Substance and Sexual Activity  ? Alcohol use: Not on file  ? Drug use: Not on file  ? Sexual activity: Not on file  ?Other Topics Concern  ? Not on file  ?Social History Narrative  ? Not on file  ? ?Social Determinants of Health  ? ?Financial Resource Strain: Not on file  ?Food Insecurity: Not on file  ?Transportation Needs: Not on file  ?Physical Activity: Not on file  ?Stress: Not on file  ?Social Connections: Not on file  ?  ? ?  Family History: ?CAD ? ?ROS:   ?Please see the history of present illness.    ? All other systems reviewed and are negative. ? ?EKGs/Labs/Other Studies Reviewed:   ? ?The following studies were reviewed today: ? ? ?EKG:  EKG is  ordered today.  The ekg ordered today demonstrates  ? ?EKG 5/2-Sinus bradycardia, LVH. Septal Q ? ?Recent Labs: ?No results found for requested labs within last 8760 hours.  ?Recent Lipid Panel ? ?09/16/2021 ?TC 188 mg/dL  ?LDL 102 mg/dL ?HDL 61 mg/dL  ? ? ?Risk Assessment/Calculations:   ?  ? ?    ? ?Physical Exam:   ? ?VS:   ?Vitals:  ? 12/01/21 1336  ?BP: 126/72  ?Pulse: (!) 55  ?SpO2: 99%  ? ? ? ?Wt Readings from Last 3 Encounters:  ?No data found for Wt  ?  ? ?GEN:  Well nourished, well developed in no acute distress ?HEENT: Normal ?NECK: No JVD; No carotid bruits ?LYMPHATICS: No  lymphadenopathy ?CARDIAC: RRR, no murmurs, rubs, gallops ?RESPIRATORY:  Clear to auscultation without rales, wheezing or rhonchi  ?ABDOMEN: Soft, non-tender, non-distended ?MUSCULOSKELETAL:  No edema; No deformity  ?SKIN: Warm and dry ?NEUROLOGIC:  Alert and oriented x 3 ?PSYCHIATRIC:  Normal affect  ? ?ASSESSMENT:   ? ?Ischemic heart disease: Asymptomatic. No signs of CHF. ?- increase  lipitor 20 mg daily and zetia 10 mg daily ( LDL goal < 70 mg/dL) ?- continue metop tartrate 25 mg BID ?- continue aspirin ? ?HTN- continue lisinopril 10 mg daily ?PLAN:   ? ?In order of problems listed above: ? ?Increase Atorvastatin 40 ?Fasting lipids profile ?Follow up 6 months ? ?   ? ?   ? ? ?Medication Adjustments/Labs and Tests Ordered: ?Current medicines are reviewed at length with the patient today.  Concerns regarding medicines are outlined above.  ?No orders of the defined types were placed in this encounter. ? ?No orders of the defined types were placed in this encounter. ? ? ?There are no Patient Instructions on file for this visit.  ? ?Signed, ?Maisie Fus, MD  ?12/01/2021 9:38 AM    ?[facility] ?

## 2021-12-01 NOTE — Patient Instructions (Addendum)
Medication Instructions:  ?START: ATORVASTATIN 40mg  ONCE DAILY  ?*If you need a refill on your cardiac medications before your next appointment, please call your pharmacy* ? ?Lab Work: ?Please return for FASTING Blood Work in [employer]. No appointment needed, lab here at the office is open Monday-Friday from 8AM to 4PM and closed daily for lunch from 12:45-1:45.  ? ?If you have labs (blood work) drawn today and your tests are completely normal, you will receive your results only by: ?MyChart Message (if you have MyChart) OR ?A paper copy in the mail ?If you have any lab test that is abnormal or we need to change your treatment, we will call you to review the results. ? ?Testing/Procedures: ?None Ordered At This Time.  ? ?Follow-Up: ?At [facility], you and your health needs are our priority.  As part of our continuing mission to provide you with exceptional heart care, we have created designated Provider Care Teams.  These Care Teams include your primary Cardiologist (physician) and Advanced Practice Providers (APPs -  Physician Assistants and Nurse Practitioners) who all work together to provide you with the care you need, when you need it. ? ?We recommend signing up for the patient portal called "MyChart".  Sign up information is provided on this After Visit Summary.  MyChart is used to connect with patients for Virtual Visits (Telemedicine).  Patients are able to view lab/test results, encounter notes, upcoming appointments, etc.  Non-urgent messages can be sent to your provider as well.   ?To learn more about what you can do with MyChart, go to [email or web address].   ? ?Your next appointment:   ?6 month(s) ? ?The format for your next appointment:   ?In Person ? ?Provider:   ?Janina Mayo, MD   ? ? ?  ?

## 2021-12-02 DIAGNOSIS — R7301 Impaired fasting glucose: Secondary | ICD-10-CM | POA: Diagnosis not present

## 2021-12-02 DIAGNOSIS — I2510 Atherosclerotic heart disease of native coronary artery without angina pectoris: Secondary | ICD-10-CM | POA: Diagnosis not present

## 2021-12-02 DIAGNOSIS — R82998 Other abnormal findings in urine: Secondary | ICD-10-CM | POA: Diagnosis not present

## 2021-12-02 DIAGNOSIS — I10 Essential (primary) hypertension: Secondary | ICD-10-CM | POA: Diagnosis not present

## 2021-12-10 ENCOUNTER — Ambulatory Visit
Admission: RE | Admit: 2021-12-10 | Discharge: 2021-12-10 | Disposition: A | Discharge status: Home / Self Care | Payer: Medicare HMO | Source: Ambulatory Visit | Attending: Internal Medicine | Admitting: Internal Medicine

## 2021-12-10 DIAGNOSIS — F17200 Nicotine dependence, unspecified, uncomplicated: Secondary | ICD-10-CM

## 2021-12-10 DIAGNOSIS — J432 Centrilobular emphysema: Secondary | ICD-10-CM | POA: Diagnosis not present

## 2021-12-10 DIAGNOSIS — I2510 Atherosclerotic heart disease of native coronary artery without angina pectoris: Secondary | ICD-10-CM | POA: Diagnosis not present

## 2021-12-10 DIAGNOSIS — I700 Atherosclerosis of aorta: Secondary | ICD-10-CM | POA: Diagnosis not present

## 2021-12-10 DIAGNOSIS — Z87891 Personal history of nicotine dependence: Secondary | ICD-10-CM | POA: Diagnosis not present

## 2021-12-11 DIAGNOSIS — I10 Essential (primary) hypertension: Secondary | ICD-10-CM | POA: Diagnosis not present

## 2022-06-03 DIAGNOSIS — Z23 Encounter for immunization: Secondary | ICD-10-CM | POA: Diagnosis not present

## 2022-06-04 DIAGNOSIS — I10 Essential (primary) hypertension: Secondary | ICD-10-CM | POA: Diagnosis not present

## 2022-06-04 DIAGNOSIS — R7301 Impaired fasting glucose: Secondary | ICD-10-CM | POA: Diagnosis not present

## 2022-06-04 DIAGNOSIS — E538 Deficiency of other specified B group vitamins: Secondary | ICD-10-CM | POA: Diagnosis not present

## 2022-06-04 DIAGNOSIS — M6530 Trigger finger, unspecified finger: Secondary | ICD-10-CM | POA: Diagnosis not present

## 2022-06-04 DIAGNOSIS — M542 Cervicalgia: Secondary | ICD-10-CM | POA: Diagnosis not present

## 2022-06-04 DIAGNOSIS — R69 Illness, unspecified: Secondary | ICD-10-CM | POA: Diagnosis not present

## 2022-06-04 DIAGNOSIS — E785 Hyperlipidemia, unspecified: Secondary | ICD-10-CM | POA: Diagnosis not present

## 2022-06-04 DIAGNOSIS — I2510 Atherosclerotic heart disease of native coronary artery without angina pectoris: Secondary | ICD-10-CM | POA: Diagnosis not present

## 2022-06-07 ENCOUNTER — Encounter: Payer: Self-pay | Admitting: Internal Medicine

## 2022-06-07 ENCOUNTER — Ambulatory Visit: Payer: Medicare HMO | Attending: Internal Medicine | Admitting: Internal Medicine

## 2022-06-07 VITALS — BP 142/78 | HR 83 | Ht 67.0 in | Wt 171.2 lb

## 2022-06-07 DIAGNOSIS — I2510 Atherosclerotic heart disease of native coronary artery without angina pectoris: Secondary | ICD-10-CM | POA: Diagnosis not present

## 2022-06-07 MED ORDER — ASPIRIN 81 MG PO TBEC: 81.0000 mg | DELAYED_RELEASE_TABLET | Freq: Every day | ORAL | 3 refills | Status: AC

## 2022-06-07 NOTE — Progress Notes (Signed)
Cardiology Office Note:    Date:  06/07/2022   ID:  Mason Conley, DOB June 11, 1951, MRN [id number]  PCP:  Crist Infante, MD   [place] Prov[facility] Cardiologist:  Janina Mayo, MD     Referring MD: Crist Infante, MD   No chief complaint on file. Hx of CAD  History of Present Illness:    Mason Conley is a 71 y.o. male with a hx of HTN, HLD, long-term smoker, family hx of CAD,  hx of CAD and CABG referral for follow up.  Cath with Dr. Ellyn Hack in 2012. He had prior echo which showed EF 50-55 with mild to moderate anterior wall hypokinesis. Myoview showed moderate perfusion defect with peri-infarct ischemia in the mid anterior septal and anterior apical regions. Had cath. Complicated by vasospasm peripherally requiring verapamil. Cath demonstrated LM- 30-40% stenosis. LAD ostial 95% stenosis with mid 70% to 80% stenosis.  Ostial OM v ramus had 70% lesion with tandem 50-60% lesion. PDA had 80% lesion. With multivessel dx CT surgery was consulted. Underwent CABGx3 vessel (LIMA-LAD, RIMA-RI, SVG-PDA)  He sees Dr. Joylene Draft. Referral was meant for patient to be follow up with Dr. Ellyn Hack. He was noted to have an abnormal EKG. I don't have the EKG.  09/16/2021 TC- 188 mg/dL, LDL 102 mg/dL, HDL 61 mg/dL  Normal renal fxn. TSH normal.  Since this time he has followed with Dr. Joylene Draft. He spends his time with his mom who has demenita. No chest pain or SOB. He mows 2 lawns every week with a push lawnmower.  He's on atorvastatin 20 mg and zetia 10 mg. No hospitalizations. No orthopnea, PND or LE edema.   Interim hx 06/07/2022 He is doing well today. He is helping his mother who has dementia. He denies CP, SOB, orthopnea or PND.   Current Medications: Current Outpatient Medications on File Prior to Visit  Medication Sig Dispense Refill   aspirin 325 MG EC tablet Take 325 mg by mouth daily.     atorvastatin (LIPITOR) 40 MG tablet Take 1 tablet (40 mg total) by mouth daily. 90 tablet 3    Cholecalciferol (VITAMIN D3) 1000 UNITS CAPS Take 1,000 Units by mouth daily.     ezetimibe (ZETIA) 10 MG tablet Take 1 tablet by mouth daily.     lisinopril (PRINIVIL,ZESTRIL) 10 MG tablet      metoprolol tartrate (LOPRESSOR) 25 MG tablet      No current facility-administered medications on file prior to visit.    Allergies:   Patient has no known allergies.   Social History   Socioeconomic History   Marital status: Divorced    Spouse name: Not on file   Number of children: Not on file   Years of education: Not on file   Highest education level: Not on file  Occupational History   Not on file  Tobacco Use   Smoking status: Former    Packs/day: 1.50    Years: 20.00    Total pack years: 30.00    Types: Cigarettes    Quit date: 01/26/2011    Years since quitting: 11.3   Smokeless tobacco: Not on file  Substance and Sexual Activity   Alcohol use: Not on file   Drug use: Not on file   Sexual activity: Not on file  Other Topics Concern   Not on file  Social History Narrative   Not on file   Social Determinants of Health   Financial Resource Strain: Not on file  Food Insecurity: Not on file  Transportation Needs: Not on file  Physical Activity: Not on file  Stress: Not on file  Social Connections: Not on file     Family History: CAD  ROS:   Please see the history of present illness.     All other systems reviewed and are negative.  EKGs/Labs/Other Studies Reviewed:    The following studies were reviewed today:   EKG:  EKG is  ordered today.  The ekg ordered today demonstrates   EKG 5/2-Sinus bradycardia, LVH. Septal Q  EKG 11/6- NSR, septal Q, LVH repol  Recent Labs: No results found for requested labs within last 365 days.  Recent Lipid Panel  09/16/2021 TC 188 mg/dL  LDL 102 mg/dL HDL 61 mg/dL    Risk Assessment/Calculations:           Physical Exam:    VS:   There were no vitals filed for this visit.    Wt Readings from Last 3  Encounters:  12/01/21 179 lb 6.4 oz (81.4 kg)     GEN:  Well nourished, well developed in no acute distress HEENT: Normal NECK: No JVD; No carotid bruits LYMPHATICS: No lymphadenopathy CARDIAC: RRR, no murmurs, rubs, gallops RESPIRATORY:  Clear to auscultation without rales, wheezing or rhonchi  ABDOMEN: Soft, non-tender, non-distended MUSCULOSKELETAL:  No edema; No deformity  SKIN: Warm and dry NEUROLOGIC:  Alert and oriented x 3 PSYCHIATRIC:  Normal affect   ASSESSMENT:    Ischemic heart disease: Continues to be asymptomatic. No signs of CHF. - continue lipitor 40 mg daily and zetia 10 mg daily ( LDL goal < 70 mg/dL), working with Dr. Joylene Draft working on [place] or [employer].  - continue metop tartrate 25 mg , taking daily  - continue aspirin (change to 81)  HTN- continue lisinopril 10 mg daily. Notes typically good control at home 130s SBP.  PLAN:    In order of problems listed above:   ASA 81 mg daily Can defer lipids to D. Perini, has great management plan Follow up 12 months        Medication Adjustments/Labs and Tests Ordered: Current medicines are reviewed at length with the patient today.  Concerns regarding medicines are outlined above.  No orders of the defined types were placed in this encounter.  No orders of the defined types were placed in this encounter.   There are no Patient Instructions on file for this visit.   Signed, Janina Mayo, MD  06/07/2022 1:14 PM    [facility]

## 2022-06-07 NOTE — Patient Instructions (Signed)
Medication Instructions:   STOP: ASPIRIN 325  START: ASPIRIN 81mg  ONCE DAILY   *If you need a refill on your cardiac medications before your next appointment, please call your pharmacy*  Lab Work: None Ordered At This Time.  If you have labs (blood work) drawn today and your tests are completely normal, you will receive your results only by: [place] (if you have MyChart) OR A paper copy in the mail If you have any lab test that is abnormal or we need to change your treatment, we will call you to review the results.  Testing/Procedures: None Ordered At This Time.   Follow-Up: At [facility], you and your health needs are our priority.  As part of our continuing mission to provide you with exceptional heart care, we have created designated Provider Care Teams.  These Care Teams include your primary Cardiologist (physician) and Advanced Practice Providers (APPs -  Physician Assistants and Nurse Practitioners) who all work together to provide you with the care you need, when you need it.  Your next appointment:   1 year(s)  The format for your next appointment:   In Person  Provider:   Janina Mayo, MD

## 2022-06-15 NOTE — Addendum Note (Signed)
Addended by: Orlene Och on: 06/15/2022 09:32 AM   Modules accepted: Orders

## 2022-12-01 DIAGNOSIS — Z125 Encounter for screening for malignant neoplasm of prostate: Secondary | ICD-10-CM | POA: Diagnosis not present

## 2022-12-01 DIAGNOSIS — E785 Hyperlipidemia, unspecified: Secondary | ICD-10-CM | POA: Diagnosis not present

## 2022-12-01 DIAGNOSIS — E538 Deficiency of other specified B group vitamins: Secondary | ICD-10-CM | POA: Diagnosis not present

## 2022-12-01 DIAGNOSIS — N529 Male erectile dysfunction, unspecified: Secondary | ICD-10-CM | POA: Diagnosis not present

## 2022-12-01 DIAGNOSIS — R7301 Impaired fasting glucose: Secondary | ICD-10-CM | POA: Diagnosis not present

## 2022-12-01 DIAGNOSIS — Z1212 Encounter for screening for malignant neoplasm of rectum: Secondary | ICD-10-CM | POA: Diagnosis not present

## 2022-12-08 DIAGNOSIS — F17200 Nicotine dependence, unspecified, uncomplicated: Secondary | ICD-10-CM | POA: Diagnosis not present

## 2022-12-08 DIAGNOSIS — R0989 Other specified symptoms and signs involving the circulatory and respiratory systems: Secondary | ICD-10-CM | POA: Diagnosis not present

## 2022-12-08 DIAGNOSIS — I700 Atherosclerosis of aorta: Secondary | ICD-10-CM | POA: Diagnosis not present

## 2022-12-08 DIAGNOSIS — I10 Essential (primary) hypertension: Secondary | ICD-10-CM | POA: Diagnosis not present

## 2022-12-08 DIAGNOSIS — L570 Actinic keratosis: Secondary | ICD-10-CM | POA: Diagnosis not present

## 2022-12-08 DIAGNOSIS — E538 Deficiency of other specified B group vitamins: Secondary | ICD-10-CM | POA: Diagnosis not present

## 2022-12-08 DIAGNOSIS — Z0000 Encounter for general adult medical examination without abnormal findings: Secondary | ICD-10-CM | POA: Diagnosis not present

## 2022-12-08 DIAGNOSIS — Z951 Presence of aortocoronary bypass graft: Secondary | ICD-10-CM | POA: Diagnosis not present

## 2022-12-08 DIAGNOSIS — E785 Hyperlipidemia, unspecified: Secondary | ICD-10-CM | POA: Diagnosis not present

## 2022-12-08 DIAGNOSIS — J439 Emphysema, unspecified: Secondary | ICD-10-CM | POA: Diagnosis not present

## 2022-12-08 DIAGNOSIS — I2510 Atherosclerotic heart disease of native coronary artery without angina pectoris: Secondary | ICD-10-CM | POA: Diagnosis not present

## 2022-12-08 DIAGNOSIS — K4090 Unilateral inguinal hernia, without obstruction or gangrene, not specified as recurrent: Secondary | ICD-10-CM | POA: Diagnosis not present

## 2022-12-08 DIAGNOSIS — R82998 Other abnormal findings in urine: Secondary | ICD-10-CM | POA: Diagnosis not present

## 2023-01-10 DIAGNOSIS — Z1211 Encounter for screening for malignant neoplasm of colon: Secondary | ICD-10-CM | POA: Diagnosis not present

## 2023-04-29 DIAGNOSIS — H2513 Age-related nuclear cataract, bilateral: Secondary | ICD-10-CM | POA: Diagnosis not present

## 2023-04-29 DIAGNOSIS — H5213 Myopia, bilateral: Secondary | ICD-10-CM | POA: Diagnosis not present

## 2023-08-17 ENCOUNTER — Encounter: Payer: Self-pay | Admitting: Internal Medicine

## 2023-08-17 ENCOUNTER — Ambulatory Visit: Payer: Medicare HMO | Attending: Internal Medicine | Admitting: Internal Medicine

## 2023-08-17 VITALS — BP 180/90 | HR 66 | Ht 66.0 in | Wt 165.8 lb

## 2023-08-17 DIAGNOSIS — Z136 Encounter for screening for cardiovascular disorders: Secondary | ICD-10-CM | POA: Diagnosis not present

## 2023-08-17 MED ORDER — LISINOPRIL 20 MG PO TABS: 20.0000 mg | ORAL_TABLET | Freq: Every day | ORAL | 3 refills | Status: AC

## 2023-08-17 MED ORDER — AMLODIPINE BESYLATE 5 MG PO TABS: 5.0000 mg | ORAL_TABLET | Freq: Every day | ORAL | 3 refills | Status: AC

## 2023-08-17 NOTE — Patient Instructions (Signed)
 Medication Instructions:  INCREASE lisinopril  to 20mg  daily   START amlodipine  5mg  daily   *If you need a refill on your cardiac medications before your next appointment, please call your pharmacy*   Follow-Up: At [facility], you and your health needs are our priority.  As part of our continuing mission to provide you with exceptional heart care, we have created designated Provider Care Teams.  These Care Teams include your primary Cardiologist (physician) and Advanced Practice Providers (APPs -  Physician Assistants and Nurse Practitioners) who all work together to provide you with the care you need, when you need it.  We recommend signing up for the patient portal called "MyChart".  Sign up information is provided on this After Visit Summary.  MyChart is used to connect with patients for Virtual Visits (Telemedicine).  Patients are able to view lab/test results, encounter notes, upcoming appointments, etc.  Non-urgent messages can be sent to your provider as well.   To learn more about what you can do with MyChart, go to [email or web address].    Your next appointment:    6 months with PA or NP          .  1st Floor: - Lobby - Registration  - Pharmacy  - Lab - Cafe  2nd Floor: - PV Lab - Diagnostic Testing (echo, CT, nuclear med)  3rd Floor: - Vacant  4th Floor: - TCTS (cardiothoracic surgery) - AFib Clinic - Structural Heart Clinic - Vascular Surgery  - Vascular Ultrasound  5th Floor: - [facility] Cardiology (general and EP) - Clinical Pharmacy for coumadin, hypertension, lipid, weight-loss medications, and med management appointments    Valet parking services will be available as well.

## 2023-08-17 NOTE — Progress Notes (Signed)
 Cardiology Office Note:    Date:  08/17/2023   ID:  Mason Conley, DOB 11/08/1950, MRN [id number]  PCP:  Mason Hun, MD   [facility] Providers Cardiologist:  Mason Campus, MD     Referring MD: Mason Hun, MD   No chief complaint on file. Hx of CAD  History of Present Illness:    Mason Conley is a 73 y.o. male with a hx of HTN, HLD, long-term smoker, family hx of CAD,  hx of CAD and CABG referral for follow up.  Cath with Dr. Addie Holstein in 2012. He had prior echo which showed EF 50-55 with mild to moderate anterior wall hypokinesis. Myoview showed moderate perfusion defect with peri-infarct ischemia in the mid anterior septal and anterior apical regions. Had cath. Complicated by vasospasm peripherally requiring verapamil. Cath demonstrated LM- 30-40% stenosis. LAD ostial 95% stenosis with mid 70% to 80% stenosis.  Ostial OM v ramus had 70% lesion with tandem 50-60% lesion. PDA had 80% lesion. With multivessel dx CT surgery was consulted. Underwent CABGx3 vessel (LIMA-LAD, RIMA-RI, SVG-PDA)  He sees Dr. Genelle Conley. Referral was meant for patient to be follow up with Dr. Addie Holstein. He was noted to have an abnormal EKG. I don't have the EKG.  09/16/2021 TC- 188 mg/dL, LDL 409 mg/dL, HDL 61 mg/dL  Normal renal fxn. TSH normal.  Since this time he has followed with Dr. Genelle Conley. He spends his time with his mom who has demenita. No chest pain or SOB. He mows 2 lawns every week with a push lawnmower.  He's on atorvastatin  20 mg and zetia 10 mg. No hospitalizations. No orthopnea, PND or LE edema.   Interim hx 06/07/2022 He is doing well today. He is helping his mother who has dementia. He denies CP, SOB, orthopnea or PND.  Interim hx 08/17/2023 He has been doing well with his health. His brother had an MI in 2023.He denies any CP or SOB.  He is very busy working with his mother who has dementia as well as his own tasks.  He also has health needs for himself as well.  He is not getting  much sleep.   Current Medications: Current Outpatient Medications on File Prior to Visit  Medication Sig Dispense Refill   aspirin  EC 81 MG tablet Take 1 tablet (81 mg total) by mouth daily. Swallow whole. 90 tablet 3   atorvastatin  (LIPITOR) 40 MG tablet Take 1 tablet (40 mg total) by mouth daily. 90 tablet 3   Cholecalciferol (VITAMIN D3) 1000 UNITS CAPS Take 1,000 Units by mouth daily.     ezetimibe (ZETIA) 10 MG tablet Take 1 tablet by mouth daily.     lisinopril  (PRINIVIL ,ZESTRIL ) 10 MG tablet      metoprolol tartrate (LOPRESSOR) 25 MG tablet      No current facility-administered medications on file prior to visit.    Allergies:   Patient has no known allergies.   Social History   Socioeconomic History   Marital status: Divorced    Spouse name: Not on file   Number of children: Not on file   Years of education: Not on file   Highest education level: Not on file  Occupational History   Not on file  Tobacco Use   Smoking status: Former    Current packs/day: 0.00    Average packs/day: 1.5 packs/day for 20.0 years (30.0 ttl pk-yrs)    Types: Cigarettes    Start date: 01/26/1991    Quit date: 01/26/2011  Years since quitting: 12.5   Smokeless tobacco: Not on file  Substance and Sexual Activity   Alcohol  use: Not on file   Drug use: Not on file   Sexual activity: Not on file  Other Topics Concern   Not on file  Social History Narrative   Not on file   Social Drivers of Health   Financial Resource Strain: Not on file  Food Insecurity: Not on file  Transportation Needs: Not on file  Physical Activity: Not on file  Stress: Not on file  Social Connections: Not on file     Family History: CAD  ROS:   Please see the history of present illness.     All other systems reviewed and are negative.  EKGs/Labs/Other Studies Reviewed:    The following studies were reviewed today:   EKG:  EKG is  ordered today.  The ekg ordered today demonstrates   EKG 5/2-Sinus  bradycardia, LVH. Septal Q  EKG 11/6- NSR, septal Q, LVH repol  Recent Labs: No results found for requested labs within last 365 days.  Recent Lipid Panel  09/16/2021 TC 188 mg/dL  LDL 161 mg/dL HDL 61 mg/dL    Risk Assessment/Calculations:           Physical Exam:    VS:   Vitals:   08/17/23 0900  BP: (!) 180/90  Pulse: 66     Wt Readings from Last 3 Encounters:  06/07/22 171 lb 3.2 oz (77.7 kg)  12/01/21 179 lb 6.4 oz (81.4 kg)     GEN:  Well nourished, well developed in no acute distress HEENT: Normal NECK: No JVD; No carotid bruits LYMPHATICS: No lymphadenopathy CARDIAC: RRR, no murmurs, rubs, gallops RESPIRATORY:  Clear to auscultation without rales, wheezing or rhonchi  ABDOMEN: Soft, non-tender, non-distended MUSCULOSKELETAL:  No edema; No deformity  SKIN: Warm and dry NEUROLOGIC:  Alert and oriented x 3 PSYCHIATRIC:  Normal affect   ASSESSMENT:    Ischemic heart disease: Continues to be asymptomatic. No signs of CHF. - continue lipitor 40 mg daily and zetia 10 mg daily ( LDL goal < 70 mg/dL), working with Dr. Genelle Conley - continue metop tartrate 25 mg , taking daily  - continue aspirin    HTN-very high today. will increase his lisinopril  to 20 mg daily and add norvasc  5 mg daily PLAN:    In order of problems listed above:   Follow up 6 months with an APP        Medication Adjustments/Labs and Tests Ordered: Current medicines are reviewed at length with the patient today.  Concerns regarding medicines are outlined above.  No orders of the defined types were placed in this encounter.  No orders of the defined types were placed in this encounter.   There are no Patient Instructions on file for this visit.   Signed, Mason Campus, MD  08/17/2023 8:37 AM    [facility]

## 2023-08-23 DIAGNOSIS — I10 Essential (primary) hypertension: Secondary | ICD-10-CM | POA: Diagnosis not present

## 2023-08-23 DIAGNOSIS — F17201 Nicotine dependence, unspecified, in remission: Secondary | ICD-10-CM | POA: Diagnosis not present

## 2023-08-23 DIAGNOSIS — J439 Emphysema, unspecified: Secondary | ICD-10-CM | POA: Diagnosis not present

## 2023-08-23 DIAGNOSIS — Z23 Encounter for immunization: Secondary | ICD-10-CM | POA: Diagnosis not present

## 2023-08-23 DIAGNOSIS — K4090 Unilateral inguinal hernia, without obstruction or gangrene, not specified as recurrent: Secondary | ICD-10-CM | POA: Diagnosis not present

## 2023-08-23 DIAGNOSIS — R0989 Other specified symptoms and signs involving the circulatory and respiratory systems: Secondary | ICD-10-CM | POA: Diagnosis not present

## 2023-08-23 DIAGNOSIS — E785 Hyperlipidemia, unspecified: Secondary | ICD-10-CM | POA: Diagnosis not present

## 2023-08-23 DIAGNOSIS — R7301 Impaired fasting glucose: Secondary | ICD-10-CM | POA: Diagnosis not present

## 2023-08-23 DIAGNOSIS — L570 Actinic keratosis: Secondary | ICD-10-CM | POA: Diagnosis not present

## 2023-08-23 DIAGNOSIS — Z951 Presence of aortocoronary bypass graft: Secondary | ICD-10-CM | POA: Diagnosis not present

## 2023-08-23 DIAGNOSIS — I700 Atherosclerosis of aorta: Secondary | ICD-10-CM | POA: Diagnosis not present

## 2023-08-23 DIAGNOSIS — I2510 Atherosclerotic heart disease of native coronary artery without angina pectoris: Secondary | ICD-10-CM | POA: Diagnosis not present

## 2024-03-06 DIAGNOSIS — E7849 Other hyperlipidemia: Secondary | ICD-10-CM | POA: Diagnosis not present

## 2024-03-06 DIAGNOSIS — E538 Deficiency of other specified B group vitamins: Secondary | ICD-10-CM | POA: Diagnosis not present

## 2024-03-06 DIAGNOSIS — Z1212 Encounter for screening for malignant neoplasm of rectum: Secondary | ICD-10-CM | POA: Diagnosis not present

## 2024-03-13 DIAGNOSIS — R82998 Other abnormal findings in urine: Secondary | ICD-10-CM | POA: Diagnosis not present
